# Patient Record
Sex: Male | Born: 1945 | Race: White | Hispanic: No | Marital: Married | Smoking: Former smoker
Health system: Southern US, Community
[De-identification: ages and names within clinical notes are randomized; demographics above are authoritative.]

## PROBLEM LIST (undated history)

## (undated) DIAGNOSIS — I10 Essential (primary) hypertension: Secondary | ICD-10-CM

## (undated) HISTORY — DX: Essential (primary) hypertension: I10

---

## 2013-03-27 HISTORY — PX: CATARACT EXTRACTION, BILATERAL: SHX1313

## 2013-10-02 LAB — LIPID PANEL
Cholesterol: 193 (ref 0–200)
HDL: 80 — AB (ref 35–70)
LDL Cholesterol: 102
Triglycerides: 56 (ref 40–160)

## 2013-10-02 LAB — CBC AND DIFFERENTIAL
HCT: 44 (ref 41–53)
Hemoglobin: 15.4 (ref 13.5–17.5)
Platelets: 276 (ref 150–399)
WBC: 8.3

## 2013-10-02 LAB — PSA: PSA: 4.28

## 2013-10-02 LAB — BASIC METABOLIC PANEL
BUN: 8 (ref 4–21)
CREATININE: 0.8 (ref 0.6–1.3)
GLUCOSE: 114
POTASSIUM: 4.8 (ref 3.4–5.3)
Sodium: 138 (ref 137–147)

## 2013-10-02 LAB — HEPATIC FUNCTION PANEL
ALT: 27 (ref 10–40)
AST: 30 (ref 14–40)
Alkaline Phosphatase: 80 (ref 25–125)
Bilirubin, Total: 1

## 2013-10-02 LAB — VITAMIN D 25 HYDROXY (VIT D DEFICIENCY, FRACTURES): VIT D 25 HYDROXY: 37

## 2013-10-02 LAB — TSH: TSH: 0.97 (ref 0.41–5.90)

## 2016-10-19 ENCOUNTER — Encounter: Payer: Self-pay | Admitting: Physician Assistant

## 2016-10-19 ENCOUNTER — Ambulatory Visit (INDEPENDENT_AMBULATORY_CARE_PROVIDER_SITE_OTHER): Payer: BLUE CROSS/BLUE SHIELD

## 2016-10-19 ENCOUNTER — Ambulatory Visit (INDEPENDENT_AMBULATORY_CARE_PROVIDER_SITE_OTHER): Payer: BLUE CROSS/BLUE SHIELD | Admitting: Physician Assistant

## 2016-10-19 VITALS — BP 133/86 | HR 75 | Temp 98.9°F | Resp 18 | Ht 69.0 in | Wt 227.0 lb

## 2016-10-19 DIAGNOSIS — K429 Umbilical hernia without obstruction or gangrene: Secondary | ICD-10-CM | POA: Diagnosis not present

## 2016-10-19 DIAGNOSIS — R0609 Other forms of dyspnea: Secondary | ICD-10-CM | POA: Diagnosis not present

## 2016-10-19 DIAGNOSIS — Z125 Encounter for screening for malignant neoplasm of prostate: Secondary | ICD-10-CM | POA: Diagnosis not present

## 2016-10-19 DIAGNOSIS — R059 Cough, unspecified: Secondary | ICD-10-CM

## 2016-10-19 DIAGNOSIS — G8929 Other chronic pain: Secondary | ICD-10-CM

## 2016-10-19 DIAGNOSIS — M545 Low back pain, unspecified: Secondary | ICD-10-CM

## 2016-10-19 DIAGNOSIS — Z9109 Other allergy status, other than to drugs and biological substances: Secondary | ICD-10-CM | POA: Diagnosis not present

## 2016-10-19 DIAGNOSIS — Z0000 Encounter for general adult medical examination without abnormal findings: Secondary | ICD-10-CM | POA: Diagnosis not present

## 2016-10-19 DIAGNOSIS — Z1211 Encounter for screening for malignant neoplasm of colon: Secondary | ICD-10-CM

## 2016-10-19 DIAGNOSIS — I10 Essential (primary) hypertension: Secondary | ICD-10-CM

## 2016-10-19 DIAGNOSIS — R05 Cough: Secondary | ICD-10-CM

## 2016-10-19 DIAGNOSIS — Z7689 Persons encountering health services in other specified circumstances: Secondary | ICD-10-CM | POA: Diagnosis not present

## 2016-10-19 LAB — POCT URINALYSIS DIP (MANUAL ENTRY)
Bilirubin, UA: NEGATIVE
Glucose, UA: NEGATIVE mg/dL
Ketones, POC UA: NEGATIVE mg/dL
Leukocytes, UA: NEGATIVE
NITRITE UA: NEGATIVE
PROTEIN UA: NEGATIVE mg/dL
RBC UA: NEGATIVE
SPEC GRAV UA: 1.015 (ref 1.010–1.025)
UROBILINOGEN UA: 1 U/dL
pH, UA: 8 (ref 5.0–8.0)

## 2016-10-19 MED ORDER — HYDROCHLOROTHIAZIDE 12.5 MG PO CAPS: 12.5000 mg | ORAL_CAPSULE | Freq: Every day | ORAL | 3 refills | Status: DC

## 2016-10-19 NOTE — Patient Instructions (Addendum)
   IF you received an x-ray today, you will receive an invoice from [facility]. Please contact [facility] at [phone] with questions or concerns regarding your invoice.   IF you received labwork today, you will receive an invoice from [facility]. Please contact [facility] at [phone] with questions or concerns regarding your invoice.   Our billing staff will not be able to assist you with questions regarding bills from these companies.  You will be contacted with the lab results as soon as they are available. The fastest way to get your results is to activate your My Chart account. Instructions are located on the last page of this paperwork. If you have not heard from us regarding the results in 2 weeks, please contact this office.     Keeping you healthy  Get these tests  Blood pressure- Have your blood pressure checked once a year by your healthcare provider.  Normal blood pressure is 120/80  Weight- Have your body mass index (BMI) calculated to screen for obesity.  BMI is a measure of body fat based on height and weight. You can also calculate your own BMI at [email or web address].[email or web address]/bmi/.  Cholesterol- Have your cholesterol checked every year.  Diabetes- Have your blood sugar checked regularly if you have high blood pressure, high cholesterol, have a family history of diabetes or if you are overweight.  Screening for Colon Cancer- Colonoscopy starting at age 50.  Screening may begin sooner depending on your family history and other health conditions. Follow up colonoscopy as directed by your Gastroenterologist.  Screening for Prostate Cancer- Both blood work (PSA) and a rectal exam help screen for Prostate Cancer.  Screening begins at age 40 with African-American men and at age 50 with Caucasian men.  Screening may begin sooner depending on your family history.  Take these medicines  Aspirin- One aspirin daily can help prevent Heart disease and Stroke.  Flu  shot- Every fall.  Tetanus- Every 10 years.  Zostavax- Once after the age of 60 to prevent Shingles.  Pneumonia shot- Once after the age of 65; if you are younger than 65, ask your healthcare provider if you need a Pneumonia shot.  Take these steps  Don't smoke- If you do smoke, talk to your doctor about quitting.  For tips on how to quit, go to www.smokefree.gov or call 1-800-QUIT-NOW.  Be physically active- Exercise 5 days a week for at least 30 minutes.  If you are not already physically active start slow and gradually work up to 30 minutes of moderate physical activity.  Examples of moderate activity include walking briskly, mowing the yard, dancing, swimming, bicycling, etc.  Eat a healthy diet- Eat a variety of healthy food such as fruits, vegetables, low fat milk, low fat cheese, yogurt, lean meant, poultry, fish, beans, tofu, etc. For more information go to www.thenutritionsource.org  Drink alcohol in moderation- Limit alcohol intake to less than two drinks a day. Never drink and drive.  Dentist- Brush and floss twice daily; visit your dentist twice a year.  Depression- Your emotional health is as important as your physical health. If you're feeling down, or losing interest in things you would normally enjoy please talk to your healthcare provider.  Eye exam- Visit your eye doctor every year.  Safe sex- If you may be exposed to a sexually transmitted infection, use a condom.  Seat belts- Seat belts can save your life; always wear one.  Smoke/Carbon Monoxide detectors- These detectors need to be installed on the appropriate   level of your home.  Replace batteries at least once a year.  Skin cancer- When out in the sun, cover up and use sunscreen 15 SPF or higher.  Violence- If anyone is threatening you, please tell your healthcare provider.  Living Will/ Health care power of attorney- Speak with your healthcare provider and family. 

## 2016-10-19 NOTE — Progress Notes (Signed)
Patient ID: Ernest Mckay, male    DOB: 02-20-1946, 71 y.o.   MRN: [id number]  PCP: Patient, No Pcp Per  Chief Complaint  Patient presents with  . Annual Exam  . Medication Refill    Hydrochlorothiazide    Subjective:   Presents for Annual Wellness Visit and to establish care.  Previously followed by Dr. Knox Royalty. We do not have previous records as yet.  Colorectal Cancer Screening: never Prostate Cancer Screening: unsure Bone Density Testing: never HIV Screening: never STI Screening: never, low risk Seasonal Influenza Vaccination: annually Td/Tdap Vaccination: unsure Pneumococcal Vaccination: unsure Zoster Vaccination: maybe last year Frequency of Eye evaluation: Annually with Dr. London Sheer   Patient Active Problem List   Diagnosis Date Noted  . Benign essential HTN 10/19/2016  . Environmental allergies 10/19/2016    Past Medical History:  Diagnosis Date  . Hypertension      Prior to Admission medications   Medication Sig Start Date End Date Taking? Authorizing Provider  hydrochlorothiazide (MICROZIDE) 12.5 MG capsule Take 12.5 mg by mouth daily. 07/13/16  Yes [provider]    No Known Allergies  Past Surgical History:  Procedure Laterality Date  . EYE SURGERY      History reviewed. No pertinent family history.  Social History   Social History  . Marital status: Married    Spouse name: N/A  . Number of children: N/A  . Years of education: N/A   Social History Main Topics  . Smoking status: Never Smoker  . Smokeless tobacco: Current User    Types: Snuff  . Alcohol use Yes     Comment: 6 drinks of beer   . Drug use: No  . Sexual activity: Yes   Other Topics Concern  . None   Social History Narrative  . None       Review of Systems  Constitutional: Negative.   HENT: Negative.   Eyes: Positive for discharge (LEFT only, clear) and itching (bilateral). Negative for photophobia, pain, redness and visual disturbance.    Respiratory: Positive for cough (x 1 year, non-productive, when he gets hot) and shortness of breath (with heavy exertion). Negative for apnea, choking, chest tightness, wheezing and stridor.   Cardiovascular: Negative.   Gastrointestinal: Negative.        Darker stools since starting vitamin B supplement; Increased bowel gas x 1 year, loud "growling" all day (embarrassing).  Endocrine: Negative.   Genitourinary: Positive for frequency (nocturia x 1, improved from 2-3x when stopped drinking liquids after 10 pm).  Musculoskeletal: Positive for back pain (does heavy lifting at work, improves with drinking beer; present x several years, previously saw a [facility]). Negative for arthralgias, gait problem, joint swelling, myalgias, neck pain and neck stiffness.  Skin: Negative.   Allergic/Immunologic: Negative.   Neurological: Negative.   Hematological: Negative.   Psychiatric/Behavioral: Negative.         Objective:  Physical Exam  Constitutional: He is oriented to person, place, and time. He appears well-developed and well-nourished. He is active and cooperative.  Non-toxic appearance. He does not have a sickly appearance. He does not appear ill. No distress.  BP 133/86 (BP Location: Right Arm, Patient Position: Sitting, Cuff Size: Large)   Pulse 75   Temp 98.9 F (37.2 C) (Oral)   Resp 18   Ht 5\' 9"  (1.753 m)   Wt 227 lb (103 kg)   SpO2 97%   BMI 33.52 kg/m    HENT:  Head: Normocephalic and atraumatic.  Right Ear: Hearing, tympanic membrane, external ear and ear canal normal.  Left Ear: Hearing, tympanic membrane, external ear and ear canal normal.  Nose: Nose normal.  Mouth/Throat: Uvula is midline, oropharynx is clear and moist and mucous membranes are normal. He does not have dentures. No oral lesions. No trismus in the jaw. Normal dentition. No dental abscesses, uvula swelling, lacerations or dental caries.  Eyes: Pupils are equal, round, and reactive to light.  Conjunctivae, EOM and lids are normal. Right eye exhibits no discharge. Left eye exhibits no discharge. No scleral icterus.  Fundoscopic exam:      The right eye shows no arteriolar narrowing, no AV nicking, no exudate, no hemorrhage and no papilledema.       The left eye shows no arteriolar narrowing, no AV nicking, no exudate, no hemorrhage and no papilledema.  Neck: Normal range of motion, full passive range of motion without pain and phonation normal. Neck supple. No spinous process tenderness and no muscular tenderness present. No neck rigidity. No tracheal deviation, no edema, no erythema and normal range of motion present. No thyromegaly present.  Cardiovascular: Normal rate, regular rhythm, S1 normal, S2 normal, normal heart sounds, intact distal pulses and normal pulses.  Exam reveals no gallop and no friction rub.   No murmur heard. Pulmonary/Chest: Effort normal and breath sounds normal. No respiratory distress. He has no wheezes. He has no rales.  Abdominal: Soft. Normal appearance and bowel sounds are normal. He exhibits no distension and no mass. There is no hepatosplenomegaly. There is no tenderness. There is no rebound and no guarding. A hernia is present. Hernia confirmed positive in the ventral area (umbilical, small, non-tender).  Musculoskeletal: Normal range of motion. He exhibits no edema or tenderness.       Cervical back: Normal. He exhibits normal range of motion, no tenderness, no bony tenderness, no swelling, no edema, no deformity, no laceration, no pain, no spasm and normal pulse.       Thoracic back: Normal.       Lumbar back: Normal.  Lymphadenopathy:       Head (right side): No submental, no submandibular, no tonsillar, no preauricular, no posterior auricular and no occipital adenopathy present.       Head (left side): No submental, no submandibular, no tonsillar, no preauricular, no posterior auricular and no occipital adenopathy present.    He has no cervical  adenopathy.       Right: No supraclavicular adenopathy present.       Left: No supraclavicular adenopathy present.  Neurological: He is alert and oriented to person, place, and time. He has normal strength and normal reflexes. He displays no tremor. No cranial nerve deficit. He exhibits normal muscle tone. Coordination and gait normal.  Skin: Skin is warm, dry and intact. Lesion (multiple nevi, seborrheic keratoses) noted. No abrasion, no ecchymosis, no laceration and no rash noted. He is not diaphoretic. No cyanosis or erythema. No pallor. Nails show no clubbing.  Nails are hyperpigmented, hypertrophic  Psychiatric: He has a normal mood and affect. His speech is normal and behavior is normal. Judgment and thought content normal. Cognition and memory are normal.    EKG reviewed with Dr. Katrinka BlazingSmith. NSR. No ischemia or LVH. No previous tracing for comparison.  Dg Chest 2 View  Result Date: 10/19/2016 CLINICAL DATA:  Cough for 1 year EXAM: CHEST  2 VIEW COMPARISON:  None. FINDINGS: No active infiltrate or effusion is seen. Mediastinal and hilar contours are unremarkable. The heart is  within normal limits in size. A moderate size hiatal hernia is present. There are degenerative changes throughout the thoracic spine. IMPRESSION: 1. No active cardiopulmonary disease. 2. Moderate size hiatal hernia. Electronically Signed   By: Dwyane DeePaul  Barry M.D.   On: 10/19/2016 09:59       Assessment & Plan:   Problem List Items Addressed This Visit    Benign essential HTN    Controlled. Continue HCTZ.      Relevant Medications   hydrochlorothiazide (MICROZIDE) 12.5 MG capsule   Other Relevant Orders   CBC with Differential/Platelet (Completed)   Comprehensive metabolic panel (Completed)   Lipid panel (Completed)   POCT urinalysis dipstick (Completed)   TSH (Completed)   Environmental allergies    Not bothersome enough to desire treatment.      Umbilical hernia without obstruction or gangrene     Asymptomatic. Will advise me if desires surgery referral or if develops pain.      Chronic low back pain without sciatica    Due to heavy work. No neurologic symptoms. Does not bother him enough to want additional evaluation or treatment at this time.       Other Visit Diagnoses    Annual physical exam    -  Primary   Age appropriate health guidance.   Encounter to establish care       Signed consent for release of information to obtain previous records.   Relevant Orders   Care order/instruction:   Dyspnea on exertion       Relevant Orders   DG Chest 2 View (Completed)   EKG 12-Lead (Completed)   Cough       Normal lung exam. Occurs when he gets hot. ? GERD? Allergy?   Relevant Orders   DG Chest 2 View (Completed)   Screening for colon cancer       Relevant Orders   Cologuard   Screening for prostate cancer       Relevant Orders   PSA (Completed)       Return in about 1 year (around 10/19/2017) for wellness exam.   Fernande Brashelle S. Killian Ress, PA-C [facility]

## 2016-10-20 LAB — COMPREHENSIVE METABOLIC PANEL
A/G RATIO: 2 (ref 1.2–2.2)
ALBUMIN: 4.3 g/dL (ref 3.5–4.8)
ALK PHOS: 81 IU/L (ref 39–117)
ALT: 31 IU/L (ref 0–44)
AST: 26 IU/L (ref 0–40)
BUN / CREAT RATIO: 7 — AB (ref 10–24)
BUN: 7 mg/dL — ABNORMAL LOW (ref 8–27)
Bilirubin Total: 0.9 mg/dL (ref 0.0–1.2)
CO2: 25 mmol/L (ref 20–29)
CREATININE: 0.94 mg/dL (ref 0.76–1.27)
Calcium: 9.2 mg/dL (ref 8.6–10.2)
Chloride: 102 mmol/L (ref 96–106)
GFR calc Af Amer: 95 mL/min/{1.73_m2} (ref >59)
GFR calc non Af Amer: 82 mL/min/{1.73_m2} (ref >59)
GLOBULIN, TOTAL: 2.1 g/dL (ref 1.5–4.5)
GLUCOSE: 101 mg/dL — AB (ref 65–99)
POTASSIUM: 4.6 mmol/L (ref 3.5–5.2)
SODIUM: 141 mmol/L (ref 134–144)
Total Protein: 6.4 g/dL (ref 6.0–8.5)

## 2016-10-20 LAB — LIPID PANEL
CHOL/HDL RATIO: 2.7 ratio (ref 0.0–5.0)
CHOLESTEROL TOTAL: 194 mg/dL (ref 100–199)
HDL: 71 mg/dL (ref >39)
LDL CALC: 108 mg/dL — AB (ref 0–99)
Triglycerides: 75 mg/dL (ref 0–149)
VLDL Cholesterol Cal: 15 mg/dL (ref 5–40)

## 2016-10-20 LAB — PSA: Prostate Specific Ag, Serum: 3.7 ng/mL (ref 0.0–4.0)

## 2016-10-20 LAB — CBC WITH DIFFERENTIAL/PLATELET
BASOS ABS: 0 10*3/uL (ref 0.0–0.2)
Basos: 1 %
EOS (ABSOLUTE): 0.2 10*3/uL (ref 0.0–0.4)
Eos: 3 %
HEMOGLOBIN: 14.8 g/dL (ref 13.0–17.7)
Hematocrit: 42.7 % (ref 37.5–51.0)
IMMATURE GRANS (ABS): 0 10*3/uL (ref 0.0–0.1)
Immature Granulocytes: 0 %
LYMPHS: 21 %
Lymphocytes Absolute: 1.3 10*3/uL (ref 0.7–3.1)
MCH: 33.6 pg — AB (ref 26.6–33.0)
MCHC: 34.7 g/dL (ref 31.5–35.7)
MCV: 97 fL (ref 79–97)
MONOCYTES: 10 %
Monocytes Absolute: 0.6 10*3/uL (ref 0.1–0.9)
NEUTROS ABS: 4 10*3/uL (ref 1.4–7.0)
Neutrophils: 65 %
Platelets: 320 10*3/uL (ref 150–379)
RBC: 4.41 x10E6/uL (ref 4.14–5.80)
RDW: 15.1 % (ref 12.3–15.4)
WBC: 6.1 10*3/uL (ref 3.4–10.8)

## 2016-10-20 LAB — TSH: TSH: 1.3 u[IU]/mL (ref 0.450–4.500)

## 2016-10-21 ENCOUNTER — Encounter: Payer: Self-pay | Admitting: Physician Assistant

## 2016-10-22 ENCOUNTER — Encounter: Payer: Self-pay | Admitting: Physician Assistant

## 2016-10-22 DIAGNOSIS — K429 Umbilical hernia without obstruction or gangrene: Secondary | ICD-10-CM | POA: Insufficient documentation

## 2016-10-22 DIAGNOSIS — G8929 Other chronic pain: Secondary | ICD-10-CM | POA: Insufficient documentation

## 2016-10-22 DIAGNOSIS — M545 Low back pain: Secondary | ICD-10-CM

## 2016-10-22 NOTE — Assessment & Plan Note (Signed)
Controlled.  Continue HCTZ. 

## 2016-10-22 NOTE — Assessment & Plan Note (Signed)
Not bothersome enough to desire treatment.

## 2016-10-22 NOTE — Assessment & Plan Note (Signed)
Due to heavy work. No neurologic symptoms. Does not bother him enough to want additional evaluation or treatment at this time.

## 2016-10-22 NOTE — Assessment & Plan Note (Signed)
Asymptomatic. Will advise me if desires surgery referral or if develops pain.

## 2016-11-01 ENCOUNTER — Telehealth: Payer: Self-pay | Admitting: Family Medicine

## 2016-11-01 NOTE — Telephone Encounter (Signed)
PT STATES THAT [facility] NEED US TO E-MAIL OR CALL THEM WITH A DX CODE FOR TEST

## 2016-11-02 NOTE — Telephone Encounter (Signed)
This information can be found in the record, most easily by clicking on the test in the labs tab. Diagnosis: Screening for colon cancer ICD-10 code: Z12.11

## 2016-11-02 NOTE — Telephone Encounter (Signed)
Please advise 

## 2016-11-05 LAB — COLOGUARD: Cologuard: NEGATIVE

## 2016-11-07 NOTE — Telephone Encounter (Signed)
Spoke with patient. Stool sample sent off with correct diagnosis code.

## 2016-11-20 ENCOUNTER — Encounter: Payer: Self-pay | Admitting: Physician Assistant

## 2016-11-20 ENCOUNTER — Telehealth: Payer: Self-pay | Admitting: Physician Assistant

## 2016-11-20 NOTE — Progress Notes (Signed)
Previous records reviewed from [facility]. Need to clarify with patient: 1. Smoking history (previous notes indicate every day smoker, our record states never smoker). 2. Zostavax (shingles vaccine) was ordered. Did he receive it?  Either way, he needs Shingrix vaccines (2 doses). 3. No documentation of hepatitis C screening, Pneumococcal vaccines nor tetanus vaccine.

## 2016-11-20 NOTE — Telephone Encounter (Signed)
Previous records reviewed from [facility]. Need to clarify with patient: 1. Smoking history (previous notes indicate every day smoker, our record states never smoker). What is correct? Please update record if needed. 2. Zostavax (shingles vaccine) was ordered. Did he receive it?  Either way, he needs Shingrix vaccines (2 doses). 3. No documentation of hepatitis C screening, Pneumococcal vaccines nor tetanus vaccine.  Please advise him that we will recommend those items at his next visit.  Also, the Cologuard was NEGATIVE. We will repeat it in 3 years.

## 2016-11-21 LAB — COLOGUARD: Cologuard: NEGATIVE

## 2016-11-21 NOTE — Telephone Encounter (Signed)
Smoking history changed to light smoker. Pt states he has an occasional cigar. Pt states he only remembers 1 Shingle shot. I advised him that vaccinations will be talked about next visit and gave him his Cologuard results.

## 2017-10-10 ENCOUNTER — Encounter: Payer: Self-pay | Admitting: Family Medicine

## 2017-10-10 ENCOUNTER — Ambulatory Visit (INDEPENDENT_AMBULATORY_CARE_PROVIDER_SITE_OTHER): Payer: BLUE CROSS/BLUE SHIELD | Admitting: Family Medicine

## 2017-10-10 ENCOUNTER — Other Ambulatory Visit: Payer: Self-pay

## 2017-10-10 VITALS — BP 130/70 | HR 94 | Temp 98.6°F | Resp 17 | Ht 68.11 in | Wt 216.4 lb

## 2017-10-10 DIAGNOSIS — R7301 Impaired fasting glucose: Secondary | ICD-10-CM | POA: Diagnosis not present

## 2017-10-10 DIAGNOSIS — Z131 Encounter for screening for diabetes mellitus: Secondary | ICD-10-CM

## 2017-10-10 DIAGNOSIS — Z0001 Encounter for general adult medical examination with abnormal findings: Secondary | ICD-10-CM

## 2017-10-10 DIAGNOSIS — R350 Frequency of micturition: Secondary | ICD-10-CM

## 2017-10-10 DIAGNOSIS — I10 Essential (primary) hypertension: Secondary | ICD-10-CM

## 2017-10-10 DIAGNOSIS — Z1159 Encounter for screening for other viral diseases: Secondary | ICD-10-CM | POA: Diagnosis not present

## 2017-10-10 DIAGNOSIS — Z0000 Encounter for general adult medical examination without abnormal findings: Secondary | ICD-10-CM

## 2017-10-10 NOTE — Progress Notes (Signed)
Chief Complaint  Patient presents with  . Annual Exam    cpe, former Ernest Mckay pt    Subjective:  Ernest Mckay is a 72 y.o. male here for a health maintenance visit.  Patient is established pt  Patient Active Problem List   Diagnosis Date Noted  . Umbilical hernia without obstruction or gangrene 10/22/2016  . Chronic low back pain without sciatica 10/22/2016  . Benign essential HTN 10/19/2016  . Environmental allergies 10/19/2016    Past Medical History:  Diagnosis Date  . Hypertension     Past Surgical History:  Procedure Laterality Date  . CATARACT EXTRACTION, BILATERAL Bilateral 2015     Outpatient Medications Prior to Visit  Medication Sig Dispense Refill  . hydrochlorothiazide (MICROZIDE) 12.5 MG capsule Take 1 capsule (12.5 mg total) by mouth daily. 90 capsule 3   No facility-administered medications prior to visit.     No Known Allergies   Family History  Problem Relation Age of Onset  . Heart attack Brother        "mild"  . Diabetes Maternal Grandmother      Health Habits: Dental Exam: up to date Eye Exam: up to date Exercise:  times/week on average Current exercise activities: walking/running Diet: balanced  Social History   Socioeconomic History  . Marital status: Married    Spouse name: Not on file  . Number of children: Not on file  . Years of education: 1 year of college  . Highest education level: Not on file  Occupational History  . 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 Financial resource strain: Not on file  . Food insecurity:    Worry: Not on file    Inability: Not on file  . Transportation needs:    Medical: Not on file    Non-medical: Not on file  Tobacco Use  . Smoking status: Former Smoker    Types: Cigars    Last attempt to quit: 10/11/2015    Years since quitting: 2.0  . Smokeless tobacco: Current User    Types: Snuff  . Tobacco comment: 50 years of snuff  use  Substance and Sexual Activity  . Alcohol use: Yes    Alcohol/week: 0.6 - 1.2 oz    Types: 1 - 2 Cans of beer per week    Comment: case/week (beer)  . Drug use: No  . Sexual activity: Yes  Lifestyle  . Physical activity:    Days per week: Not on file    Minutes per session: Not on file  . Stress: Not on file  Relationships  . Social connections:    Talks on phone: Not on file    Gets together: Not on file    Attends religious service: Not on file    Active member of club or organization: Not on file    Attends meetings of clubs or organizations: Not on file    Relationship status: Not on file  . Intimate partner violence:    Fear of current or ex partner: Not on file    Emotionally abused: Not on file    Physically abused: Not on file    Forced sexual activity: Not on file  Other Topics Concern  . Not on file  Social History Narrative   Lives with his wife.   Social History   Substance and Sexual Activity  Alcohol Use Yes  . Alcohol/week: 0.6 - 1.2 oz  .  Types: 1 - 2 Cans of beer per week   Comment: case/week (beer)   Social History   Tobacco Use  Smoking Status Former Smoker  . Types: Cigars  . Last attempt to quit: 10/11/2015  . Years since quitting: 2.0  Smokeless Tobacco Current User  . Types: Snuff  Tobacco Comment   50 years of snuff use   Social History   Substance and Sexual Activity  Drug Use No    Health Maintenance: See under health Maintenance activity for review of completion dates as well.  There is no immunization history on file for this patient.    Depression Screen-PHQ2/9 Depression screen [facility] 2/9 10/10/2017 10/19/2016  Decreased Interest 0 0  Down, Depressed, Hopeless 0 0  PHQ - 2 Score 0 0       Depression Severity and Treatment Recommendations:  0-4= None  5-9= Mild / Treatment: Support, educate to call if worse; return in one month  10-14= Moderate / Treatment: Support, watchful waiting; Antidepressant or Psycotherapy   15-19= Moderately severe / Treatment: Antidepressant OR Psychotherapy  >= 20 = Major depression, severe / Antidepressant AND Psychotherapy    Review of Systems   Review of Systems  Constitutional: Negative for chills and fever.  HENT: Negative for ear discharge, ear pain and tinnitus.   Eyes: Positive for blurred vision. Negative for double vision.       History of cataracts.   Respiratory: Negative for cough, shortness of breath and wheezing.   Cardiovascular: Negative for chest pain, palpitations and orthopnea.  Gastrointestinal: Negative for abdominal pain, diarrhea, nausea and vomiting.  Genitourinary: Negative for dysuria, frequency and urgency.  Skin: Negative for itching and rash.  Neurological: Positive for dizziness. Negative for tingling, tremors and headaches.       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 The patient is not nervous/anxious.     See HPI for ROS as well.    Objective:   Vitals:   10/10/17 1408  BP: 130/70  Pulse: 94  Resp: 17  Temp: 98.6 F (37 C)  TempSrc: Oral  SpO2: 99%  Weight: 216 lb 6.4 oz (98.2 kg)  Height: 5' 8.11" (1.73 m)    Wt Readings from Last 3 Encounters:  10/10/17 216 lb 6.4 oz (98.2 kg)  10/19/16 227 lb (103 kg)    Body mass index is 32.8 kg/m.  Physical Exam  BP 130/70 (BP Location: Right Arm, Patient Position: Sitting, Cuff Size: Normal)   Pulse 94   Temp 98.6 F (37 C) (Oral)   Resp 17   Ht 5' 8.11" (1.73 m)   Wt 216 lb 6.4 oz (98.2 kg)   SpO2 99%   BMI 32.80 kg/m   General Appearance:    Alert, cooperative, no distress, appears stated age  Head:    Normocephalic, without obvious abnormality, atraumatic  Eyes:    PERRL, conjunctiva/corneas clear, EOM's intact, fundi    benign, both eyes       Ears:    Normal TM's and external ear canals, both ears  Nose:   Nares normal, septum midline, mucosa normal, no drainage   or sinus tenderness    Throat:   Lips, mucosa, and tongue normal; teeth and gums normal  Neck:   Supple, symmetrical, trachea midline, no adenopathy;       thyroid:  No enlargement/tenderness/nodules; no carotid   bruit or JVD  Back:     Symmetric, no curvature, ROM normal,  no CVA tenderness  Lungs:     Clear to auscultation bilaterally, respirations unlabored  Chest wall:    No tenderness or deformity  Heart:    Regular rate and rhythm, S1 and S2 normal, no murmur, rub   or gallop  Abdomen:     Soft, non-tender, bowel sounds active all four quadrants,    Reducible umbilical hernia, no organomegaly  Rectal:    Normal tone, normal prostate, no masses or tenderness;   guaiac negative stool  Extremities:   Extremities normal, atraumatic, no cyanosis or edema  Pulses:   2+ and symmetric all extremities  Skin:   Skin color, texture, turgor normal, no rashes or lesions  Lymph nodes:   Cervical, supraclavicular, and axillary nodes normal  Neurologic:   CNII-XII intact. Normal strength, sensation and reflexes      throughout      Assessment/Plan:   Patient was seen for a health maintenance exam.  Counseled the patient on health maintenance issues. Reviewed her health mainteance schedule and ordered appropriate tests (see orders.) Counseled on regular exercise and weight management. Recommend regular eye exams and dental cleaning.   The following issues were addressed today for health maintenance:   Breandan was seen today for annual exam.  Diagnoses and all orders for this visit:  Annual physical exam -  Pt declined vaccinations Discussed cancer screenings Agreeable to prostate exam  Impaired fasting blood sugar Screening for diabetes mellitus  will check a1c -     Hemoglobin A1c  Essential hypertension- Patient's blood pressure is at goal of 139/89 or less. Condition is stable. Continue current medications and treatment plan. I recommend that you exercise for 30-45 minutes 5 days a week. I also  recommend a balanced diet with fruits and vegetables every day, lean meats, and little fried foods. The DASH diet (you can find this online) is a good example of this.  -     Lipid panel -     Comprehensive metabolic panel  Need for hepatitis C screening test -     HCV Ab w/Rflx to Verification      Return in about 1 year (around 10/11/2018) for physical.    Body mass index is 32.8 kg/m.:  Discussed the patient's BMI with patient. The BMI body mass index is 32.8 kg/m.     No future appointments.  Patient Instructions       IF you received an x-ray today, you will receive an invoice from [facility]. Please contact [facility] at [phone] with questions or concerns regarding your invoice.   IF you received labwork today, you will receive an invoice from [facility]. Please contact [facility] at [phone] with questions or concerns regarding your invoice.   Our billing staff will not be able to assist you with questions regarding bills from these companies.  You will be contacted with the lab results as soon as they are available. The fastest way to get your results is to activate your My Chart account. Instructions are located on the last page of this paperwork. If you have not heard from [place] regarding the results in 2 weeks, please contact this office.       Preventive Care 23 Years and Older, Male Preventive care refers to lifestyle choices and visits with your health care provider that can promote health and wellness. What does preventive care include?  A yearly physical exam. This is also called an annual well check.  Dental exams once or twice a year.  Routine eye exams. Ask your health care provider how often you should have your eyes checked.  Personal lifestyle choices, including: ? Daily care of your teeth and gums. ? Regular physical activity. ? Eating a healthy diet. ? Avoiding tobacco and drug use. ? Limiting alcohol  use. ? Practicing safe sex. ? Taking low doses of aspirin every day. ? Taking vitamin and mineral supplements as recommended by your health care provider. What happens during an annual well check? The services and screenings done by your health care provider during your annual well check will depend on your age, overall health, lifestyle risk factors, and family history of disease. Counseling Your health care provider may ask you questions about your:  Alcohol use.  Tobacco use.  Drug use.  Emotional well-being.  Home and relationship well-being.  Sexual activity.  Eating habits.  History of falls.  Memory and ability to understand (cognition).  Work and work Statistician.  Screening You may have the following tests or measurements:  Height, weight, and BMI.  Blood pressure.  Lipid and cholesterol levels. These may be checked every 5 years, or more frequently if you are over 52 years old.  Skin check.  Lung cancer screening. You may have this screening every year starting at age 34 if you have a 30-pack-year history of smoking and currently smoke or have quit within the past 15 years.  Fecal occult blood test (FOBT) of the stool. You may have this test every year starting at age 84.  Flexible sigmoidoscopy or colonoscopy. You may have a sigmoidoscopy every 5 years or a colonoscopy every 10 years starting at age 38.  Prostate cancer screening. Recommendations will vary depending on your family history and other risks.  Hepatitis C blood test.  Hepatitis B blood test.  Sexually transmitted disease (STD) testing.  Diabetes screening. This is done by checking your blood sugar (glucose) after you have not eaten for a while (fasting). You may have this done every 1-3 years.  Abdominal aortic aneurysm (AAA) screening. You may need this if you are a current or former smoker.  Osteoporosis. You may be screened starting at age 49 if you are at high risk.  Talk with  your health care provider about your test results, treatment options, and if necessary, the need for more tests. Vaccines Your health care provider may recommend certain vaccines, such as:  Influenza vaccine. This is recommended every year.  Tetanus, diphtheria, and acellular pertussis (Tdap, Td) vaccine. You may need a Td booster every 10 years.  Varicella vaccine. You may need this if you have not been vaccinated.  Zoster vaccine. You may need this after age 19.  Measles, mumps, and rubella (MMR) vaccine. You may need at least one dose of MMR if you were born in 1957 or later. You may also need a second dose.  Pneumococcal 13-valent conjugate (PCV13) vaccine. One dose is recommended after age 70.  Pneumococcal polysaccharide (PPSV23) vaccine. One dose is recommended after age 52.  Meningococcal vaccine. You may need this if you have certain conditions.  Hepatitis A vaccine. You may need this if you have certain conditions or if you travel or work in places where you may be exposed to hepatitis A.  Hepatitis B vaccine. You may need this if you have certain conditions or if you travel or work in places where you may be exposed to hepatitis B.  Haemophilus influenzae type b (Hib) vaccine. You may need this if you have certain risk factors.  Talk to your health care provider about which screenings and vaccines you need and how often you need them. This information is not intended to replace advice given to you by your health care provider. Make sure you discuss any questions you have with your health care provider. Document Released: 04/09/2015 Document Revised: 12/01/2015 Document Reviewed: 01/12/2015 Elsevier Interactive Patient Education  [employer].

## 2017-10-10 NOTE — Patient Instructions (Addendum)
   IF you received an x-ray today, you will receive an invoice from [facility]. Please contact [facility] at [phone] with questions or concerns regarding your invoice.   IF you received labwork today, you will receive an invoice from [facility]. Please contact [facility] at [phone] with questions or concerns regarding your invoice.   Our billing staff will not be able to assist you with questions regarding bills from these companies.  You will be contacted with the lab results as soon as they are available. The fastest way to get your results is to activate your My Chart account. Instructions are located on the last page of this paperwork. If you have not heard from us regarding the results in 2 weeks, please contact this office.      Preventive Care 71 Years and Older, Male Preventive care refers to lifestyle choices and visits with your health care provider that can promote health and wellness. What does preventive care include?  A yearly physical exam. This is also called an annual well check.  Dental exams once or twice a year.  Routine eye exams. Ask your health care provider how often you should have your eyes checked.  Personal lifestyle choices, including: ? Daily care of your teeth and gums. ? Regular physical activity. ? Eating a healthy diet. ? Avoiding tobacco and drug use. ? Limiting alcohol use. ? Practicing safe sex. ? Taking low doses of aspirin every day. ? Taking vitamin and mineral supplements as recommended by your health care provider. What happens during an annual well check? The services and screenings done by your health care provider during your annual well check will depend on your age, overall health, lifestyle risk factors, and family history of disease. Counseling Your health care provider may ask you questions about your:  Alcohol use.  Tobacco use.  Drug use.  Emotional well-being.  Home and relationship  well-being.  Sexual activity.  Eating habits.  History of falls.  Memory and ability to understand (cognition).  Work and work environment.  Screening You may have the following tests or measurements:  Height, weight, and BMI.  Blood pressure.  Lipid and cholesterol levels. These may be checked every 5 years, or more frequently if you are over 50 years old.  Skin check.  Lung cancer screening. You may have this screening every year starting at age 55 if you have a 30-pack-year history of smoking and currently smoke or have quit within the past 15 years.  Fecal occult blood test (FOBT) of the stool. You may have this test every year starting at age 50.  Flexible sigmoidoscopy or colonoscopy. You may have a sigmoidoscopy every 5 years or a colonoscopy every 10 years starting at age 50.  Prostate cancer screening. Recommendations will vary depending on your family history and other risks.  Hepatitis C blood test.  Hepatitis B blood test.  Sexually transmitted disease (STD) testing.  Diabetes screening. This is done by checking your blood sugar (glucose) after you have not eaten for a while (fasting). You may have this done every 1-3 years.  Abdominal aortic aneurysm (AAA) screening. You may need this if you are a current or former smoker.  Osteoporosis. You may be screened starting at age 70 if you are at high risk.  Talk with your health care provider about your test results, treatment options, and if necessary, the need for more tests. Vaccines Your health care provider may recommend certain vaccines, such as:  Influenza vaccine. This   is recommended every year.  Tetanus, diphtheria, and acellular pertussis (Tdap, Td) vaccine. You may need a Td booster every 10 years.  Varicella vaccine. You may need this if you have not been vaccinated.  Zoster vaccine. You may need this after age 60.  Measles, mumps, and rubella (MMR) vaccine. You may need at least one dose of  MMR if you were born in 1957 or later. You may also need a second dose.  Pneumococcal 13-valent conjugate (PCV13) vaccine. One dose is recommended after age 65.  Pneumococcal polysaccharide (PPSV23) vaccine. One dose is recommended after age 65.  Meningococcal vaccine. You may need this if you have certain conditions.  Hepatitis A vaccine. You may need this if you have certain conditions or if you travel or work in places where you may be exposed to hepatitis A.  Hepatitis B vaccine. You may need this if you have certain conditions or if you travel or work in places where you may be exposed to hepatitis B.  Haemophilus influenzae type b (Hib) vaccine. You may need this if you have certain risk factors.  Talk to your health care provider about which screenings and vaccines you need and how often you need them. This information is not intended to replace advice given to you by your health care provider. Make sure you discuss any questions you have with your health care provider. Document Released: 04/09/2015 Document Revised: 12/01/2015 Document Reviewed: 01/12/2015 Elsevier Interactive Patient Education  2018 Elsevier Inc.  

## 2017-10-11 LAB — COMPREHENSIVE METABOLIC PANEL
A/G RATIO: 2 (ref 1.2–2.2)
ALBUMIN: 4.6 g/dL (ref 3.5–4.8)
ALT: 62 IU/L — ABNORMAL HIGH (ref 0–44)
AST: 78 IU/L — ABNORMAL HIGH (ref 0–40)
Alkaline Phosphatase: 157 IU/L — ABNORMAL HIGH (ref 39–117)
BILIRUBIN TOTAL: 1.8 mg/dL — AB (ref 0.0–1.2)
BUN / CREAT RATIO: 9 — AB (ref 10–24)
BUN: 7 mg/dL — ABNORMAL LOW (ref 8–27)
CHLORIDE: 95 mmol/L — AB (ref 96–106)
CO2: 23 mmol/L (ref 20–29)
Calcium: 9 mg/dL (ref 8.6–10.2)
Creatinine, Ser: 0.81 mg/dL (ref 0.76–1.27)
GFR calc non Af Amer: 90 mL/min/{1.73_m2} (ref >59)
GFR, EST AFRICAN AMERICAN: 104 mL/min/{1.73_m2} (ref >59)
GLOBULIN, TOTAL: 2.3 g/dL (ref 1.5–4.5)
Glucose: 95 mg/dL (ref 65–99)
Potassium: 3.9 mmol/L (ref 3.5–5.2)
SODIUM: 136 mmol/L (ref 134–144)
TOTAL PROTEIN: 6.9 g/dL (ref 6.0–8.5)

## 2017-10-11 LAB — LIPID PANEL
CHOL/HDL RATIO: 2.1 ratio (ref 0.0–5.0)
Cholesterol, Total: 187 mg/dL (ref 100–199)
HDL: 88 mg/dL (ref >39)
LDL CALC: 89 mg/dL (ref 0–99)
TRIGLYCERIDES: 52 mg/dL (ref 0–149)
VLDL CHOLESTEROL CAL: 10 mg/dL (ref 5–40)

## 2017-10-11 LAB — HEMOGLOBIN A1C
Est. average glucose Bld gHb Est-mCnc: 108 mg/dL
Hgb A1c MFr Bld: 5.4 % (ref 4.8–5.6)

## 2017-10-11 LAB — HCV AB W/RFLX TO VERIFICATION

## 2017-10-11 LAB — HCV INTERPRETATION

## 2017-10-12 ENCOUNTER — Telehealth: Payer: Self-pay | Admitting: Family Medicine

## 2017-10-12 ENCOUNTER — Encounter: Payer: Self-pay | Admitting: Family Medicine

## 2017-10-12 DIAGNOSIS — R740 Nonspecific elevation of levels of transaminase and lactic acid dehydrogenase [LDH]: Principal | ICD-10-CM

## 2017-10-12 DIAGNOSIS — Z113 Encounter for screening for infections with a predominantly sexual mode of transmission: Secondary | ICD-10-CM

## 2017-10-12 DIAGNOSIS — R7401 Elevation of levels of liver transaminase levels: Secondary | ICD-10-CM

## 2017-10-12 NOTE — Telephone Encounter (Signed)
Left a detailed message that the patient is having elevation of the liver enzymes. Left message stating that if he has been using alcohol, tylenol or antibiotics it could increase his liver enzymes. His hep c was negative. Advise him to return to clinic in one month for lab testing. I would like to repeat the hepatic panel with a hepatitis panel and std testing.  Orders have been placed.

## 2017-10-15 ENCOUNTER — Other Ambulatory Visit: Payer: Self-pay | Admitting: *Deleted

## 2017-10-15 DIAGNOSIS — I10 Essential (primary) hypertension: Secondary | ICD-10-CM

## 2017-10-15 MED ORDER — HYDROCHLOROTHIAZIDE 12.5 MG PO CAPS: 12.5000 mg | ORAL_CAPSULE | Freq: Every day | ORAL | 3 refills | Status: DC

## 2017-10-15 NOTE — Telephone Encounter (Signed)
Patient called and states he would like to talk to Dr. Eldred MangesStalling about the message she left for him on last Friday. Please see below message CB# [phone]( this is his work number , When calling ask for the patient )

## 2017-10-15 NOTE — Telephone Encounter (Signed)
Patient was advised to refrain from alcohol and tylenol will come back in for his lab draw in 1 month. A refill of his blood pressure medication was sent in .Marland Kitchen..Marland Kitchen

## 2017-11-14 ENCOUNTER — Ambulatory Visit: Payer: BLUE CROSS/BLUE SHIELD

## 2017-11-14 DIAGNOSIS — R740 Nonspecific elevation of levels of transaminase and lactic acid dehydrogenase [LDH]: Secondary | ICD-10-CM

## 2017-11-14 DIAGNOSIS — Z113 Encounter for screening for infections with a predominantly sexual mode of transmission: Secondary | ICD-10-CM

## 2017-11-14 DIAGNOSIS — R7401 Elevation of levels of liver transaminase levels: Secondary | ICD-10-CM

## 2017-11-15 LAB — HEPATITIS PANEL, ACUTE
Hep A IgM: NEGATIVE
Hep B C IgM: NEGATIVE
Hep C Virus Ab: <0.1 s/co ratio (ref 0.0–0.9)
Hepatitis B Surface Ag: NEGATIVE

## 2017-11-15 LAB — HEPATIC FUNCTION PANEL
ALT: 35 IU/L (ref 0–44)
AST: 41 IU/L — AB (ref 0–40)
Albumin: 4.2 g/dL (ref 3.5–4.8)
Alkaline Phosphatase: 98 IU/L (ref 39–117)
BILIRUBIN TOTAL: 0.8 mg/dL (ref 0.0–1.2)
BILIRUBIN, DIRECT: 0.19 mg/dL (ref 0.00–0.40)
Total Protein: 6.4 g/dL (ref 6.0–8.5)

## 2017-11-15 LAB — HIV ANTIBODY (ROUTINE TESTING W REFLEX): HIV Screen 4th Generation wRfx: NONREACTIVE

## 2017-11-15 LAB — RPR: RPR Ser Ql: NONREACTIVE

## 2017-11-30 ENCOUNTER — Telehealth: Payer: Self-pay | Admitting: Family Medicine

## 2017-11-30 NOTE — Telephone Encounter (Signed)
Copied from CRM [id number]. Topic: Quick Communication - Lab Results >> Nov 30, 2017  2:53 PM Alexander Bergeron B wrote: Pt called to get labs sent in the mail

## 2017-12-01 NOTE — Telephone Encounter (Signed)
Message sent to  Lab to send out letter.

## 2017-12-10 NOTE — Telephone Encounter (Signed)
Pt states he has never received blood work results.  Pt would like for someone to call him at work with results.

## 2017-12-12 IMAGING — DX DG CHEST 2V
2 series · 2 of 2 positions shown · non-contrast
Comparison: None.

CLINICAL DATA: Cough for 1 year

EXAM:
CHEST  2 VIEW

[chest pa]
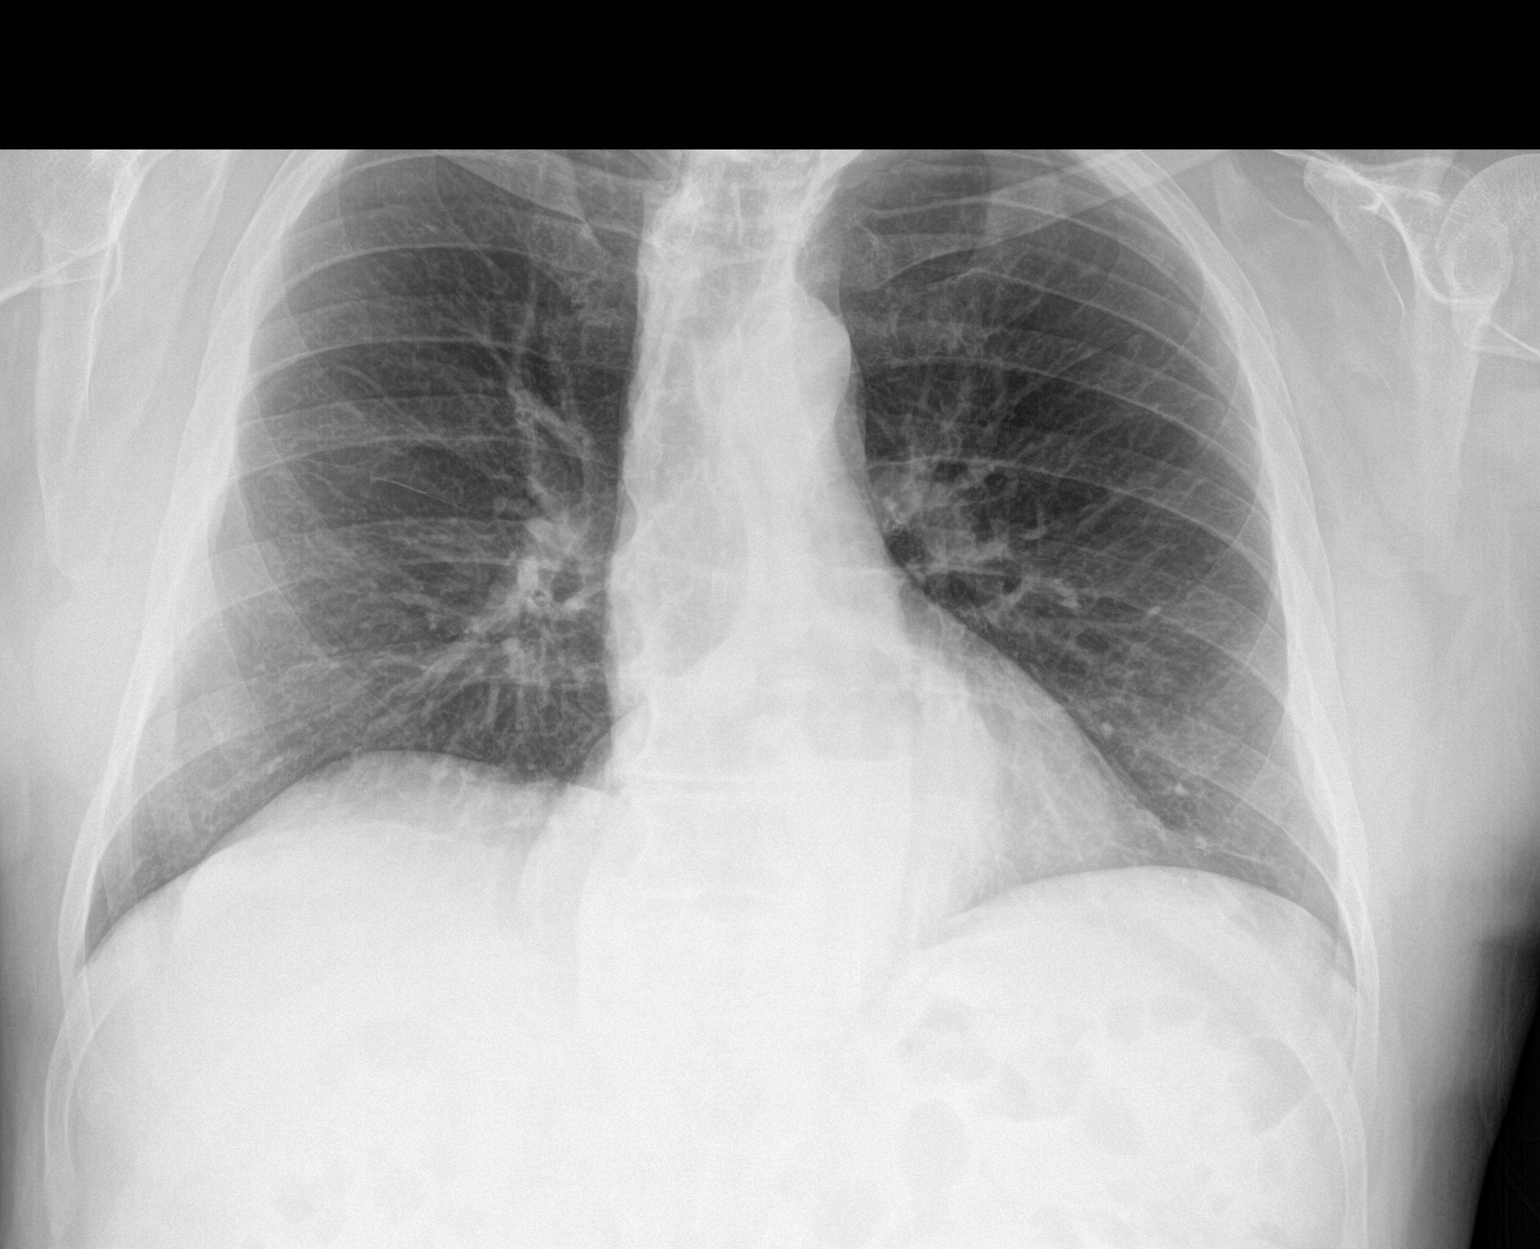

[chest lat]
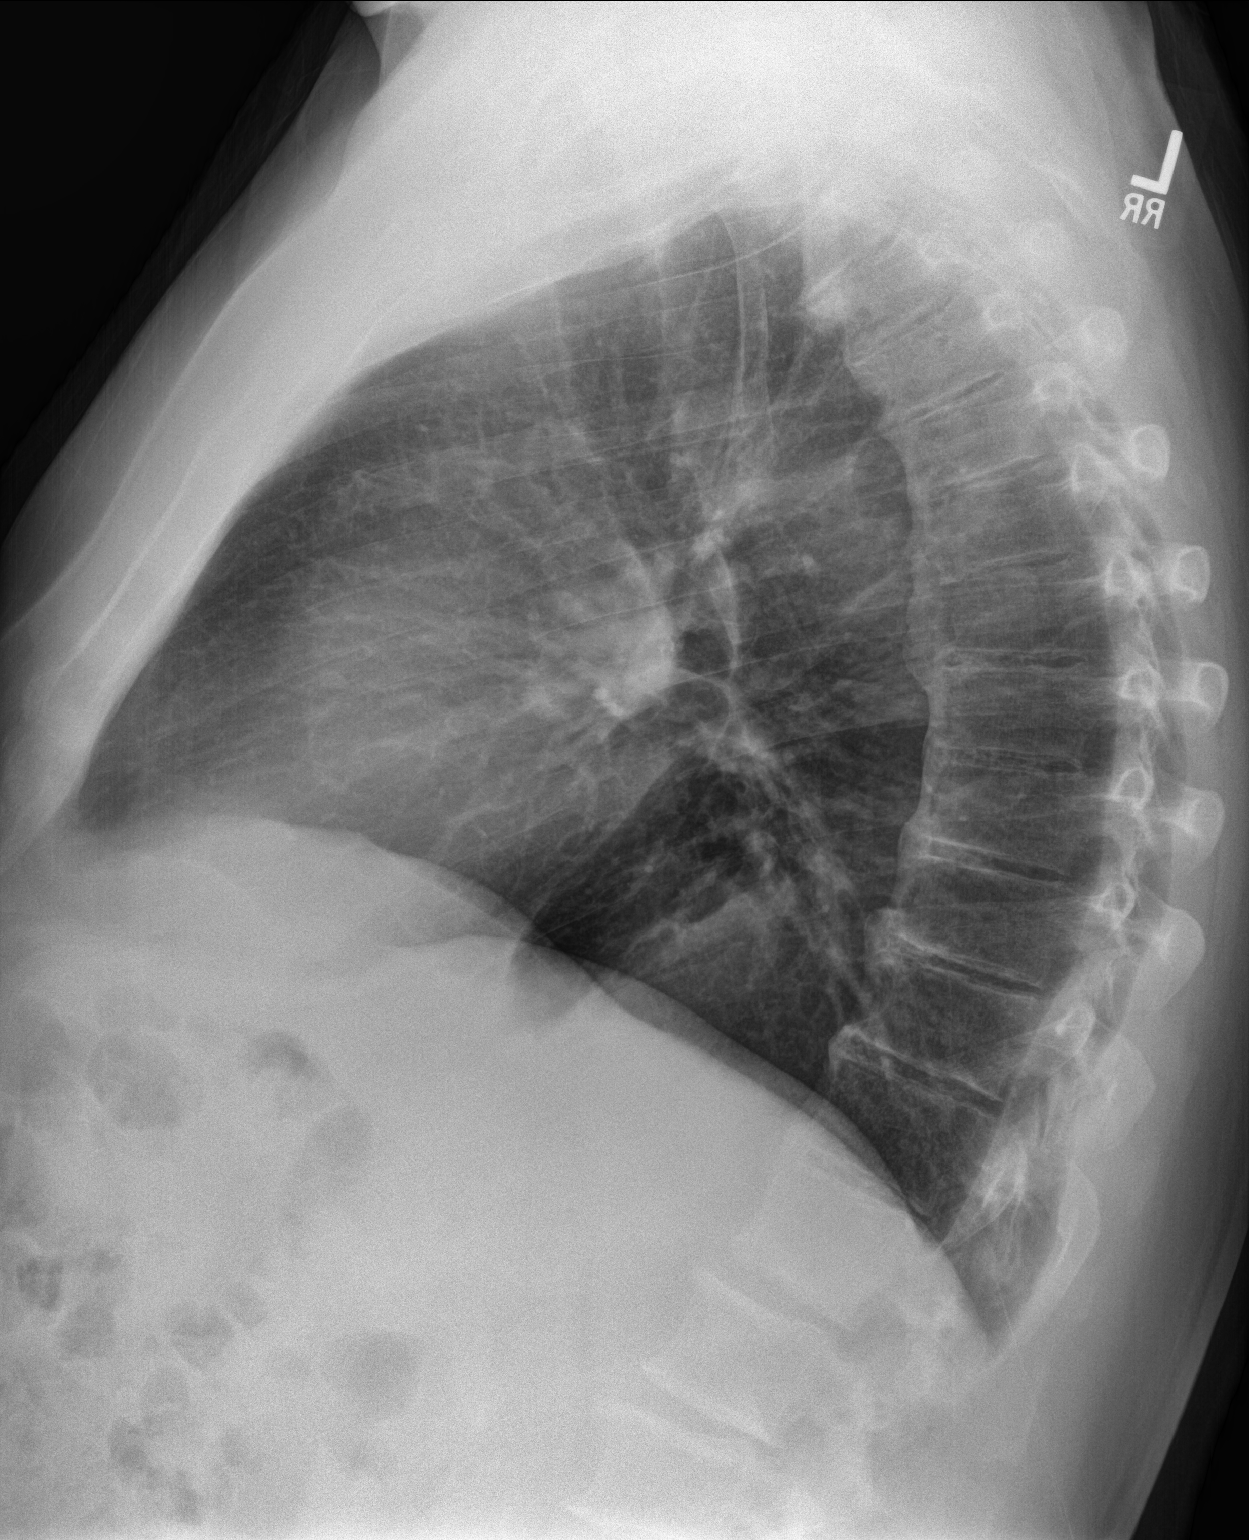

[2 of 2 positions shown; findings below may reference images not displayed]

FINDINGS: No active infiltrate or effusion is seen. Mediastinal and hilar
contours are unremarkable. The heart is within normal limits in
size. A moderate size hiatal hernia is present. There are
degenerative changes throughout the thoracic spine.
IMPRESSION: 1. No active cardiopulmonary disease.
2. Moderate size hiatal hernia.

## 2017-12-13 NOTE — Telephone Encounter (Signed)
Patient called to say that he has yet to receive lab results. He is asking for a call back today with those lab results labs was done on 11/14/17. Ph# [phone] will be at that number until 4.30pm

## 2017-12-14 NOTE — Telephone Encounter (Signed)
Please see the result note.  The patient result is normal. Std testing is negative for infection.

## 2017-12-17 NOTE — Telephone Encounter (Signed)
Patient was advised of results.  

## 2018-10-09 ENCOUNTER — Encounter: Payer: Self-pay | Admitting: Registered Nurse

## 2018-10-09 ENCOUNTER — Other Ambulatory Visit: Payer: Self-pay

## 2018-10-09 ENCOUNTER — Ambulatory Visit (INDEPENDENT_AMBULATORY_CARE_PROVIDER_SITE_OTHER): Payer: BLUE CROSS/BLUE SHIELD | Admitting: Registered Nurse

## 2018-10-09 VITALS — BP 122/82 | HR 89 | Temp 98.0°F | Resp 16 | Ht 69.09 in | Wt 210.0 lb

## 2018-10-09 DIAGNOSIS — R05 Cough: Secondary | ICD-10-CM

## 2018-10-09 DIAGNOSIS — Z13220 Encounter for screening for lipoid disorders: Secondary | ICD-10-CM | POA: Diagnosis not present

## 2018-10-09 DIAGNOSIS — R059 Cough, unspecified: Secondary | ICD-10-CM

## 2018-10-09 DIAGNOSIS — Z0001 Encounter for general adult medical examination with abnormal findings: Secondary | ICD-10-CM | POA: Diagnosis not present

## 2018-10-09 DIAGNOSIS — Z23 Encounter for immunization: Secondary | ICD-10-CM

## 2018-10-09 DIAGNOSIS — Z13228 Encounter for screening for other metabolic disorders: Secondary | ICD-10-CM

## 2018-10-09 DIAGNOSIS — Z130 Encounter for screening for diseases of the blood and blood-forming organs and certain disorders involving the immune mechanism: Secondary | ICD-10-CM

## 2018-10-09 DIAGNOSIS — Z125 Encounter for screening for malignant neoplasm of prostate: Secondary | ICD-10-CM

## 2018-10-09 DIAGNOSIS — I10 Essential (primary) hypertension: Secondary | ICD-10-CM

## 2018-10-09 DIAGNOSIS — L989 Disorder of the skin and subcutaneous tissue, unspecified: Secondary | ICD-10-CM

## 2018-10-09 DIAGNOSIS — Z1329 Encounter for screening for other suspected endocrine disorder: Secondary | ICD-10-CM | POA: Diagnosis not present

## 2018-10-09 MED ORDER — AZITHROMYCIN 250 MG PO TABS: ORAL_TABLET | ORAL | 0 refills | Status: DC

## 2018-10-09 MED ORDER — HYDROCHLOROTHIAZIDE 12.5 MG PO CAPS: 12.5000 mg | ORAL_CAPSULE | Freq: Every day | ORAL | 3 refills | Status: DC

## 2018-10-09 NOTE — Addendum Note (Signed)
Addended by: Maximiano Coss on: 10/09/2018 10:19 AM   Modules accepted: Orders

## 2018-10-09 NOTE — Patient Instructions (Addendum)
   If you have lab work done today you will be contacted with your lab results within the next 2 weeks.  If you have not heard from us then please contact us. The fastest way to get your results is to register for My Chart.   IF you received an x-ray today, you will receive an invoice from [facility]. Please contact [facility] at [phone] with questions or concerns regarding your invoice.   IF you received labwork today, you will receive an invoice from [facility]. Please contact [facility] at [phone] with questions or concerns regarding your invoice.   Our billing staff will not be able to assist you with questions regarding bills from these companies.  You will be contacted with the lab results as soon as they are available. The fastest way to get your results is to activate your My Chart account. Instructions are located on the last page of this paperwork. If you have not heard from us regarding the results in 2 weeks, please contact this office.       Health Maintenance, Male Adopting a healthy lifestyle and getting preventive care are important in promoting health and wellness. Ask your health care provider about:  The right schedule for you to have regular tests and exams.  Things you can do on your own to prevent diseases and keep yourself healthy. What should I know about diet, weight, and exercise? Eat a healthy diet   Eat a diet that includes plenty of vegetables, fruits, low-fat dairy products, and lean protein.  Do not eat a lot of foods that are high in solid fats, added sugars, or sodium. Maintain a healthy weight Body mass index (BMI) is a measurement that can be used to identify possible weight problems. It estimates body fat based on height and weight. Your health care provider can help determine your BMI and help you achieve or maintain a healthy weight. Get regular exercise Get regular exercise. This is one of the most important things  you can do for your health. Most adults should:  Exercise for at least 150 minutes each week. The exercise should increase your heart rate and make you sweat (moderate-intensity exercise).  Do strengthening exercises at least twice a week. This is in addition to the moderate-intensity exercise.  Spend less time sitting. Even light physical activity can be beneficial. Watch cholesterol and blood lipids Have your blood tested for lipids and cholesterol at 72 years of age, then have this test every 5 years. You may need to have your cholesterol levels checked more often if:  Your lipid or cholesterol levels are high.  You are older than 72 years of age.  You are at high risk for heart disease. What should I know about cancer screening? Many types of cancers can be detected early and may often be prevented. Depending on your health history and family history, you may need to have cancer screening at various ages. This may include screening for:  Colorectal cancer.  Prostate cancer.  Skin cancer.  Lung cancer. What should I know about heart disease, diabetes, and high blood pressure? Blood pressure and heart disease  High blood pressure causes heart disease and increases the risk of stroke. This is more likely to develop in people who have high blood pressure readings, are of African descent, or are overweight.  Talk with your health care provider about your target blood pressure readings.  Have your blood pressure checked: ? Every 3-5 years if you are   18-39 years of age. ? Every year if you are 40 years old or older.  If you are between the ages of 65 and 75 and are a current or former smoker, ask your health care provider if you should have a one-time screening for abdominal aortic aneurysm (AAA). Diabetes Have regular diabetes screenings. This checks your fasting blood sugar level. Have the screening done:  Once every three years after age 45 if you are at a normal weight and  have a low risk for diabetes.  More often and at a younger age if you are overweight or have a high risk for diabetes. What should I know about preventing infection? Hepatitis B If you have a higher risk for hepatitis B, you should be screened for this virus. Talk with your health care provider to find out if you are at risk for hepatitis B infection. Hepatitis C Blood testing is recommended for:  Everyone born from 1945 through 1965.  Anyone with known risk factors for hepatitis C. Sexually transmitted infections (STIs)  You should be screened each year for STIs, including gonorrhea and chlamydia, if: ? You are sexually active and are younger than 72 years of age. ? You are older than 72 years of age and your health care provider tells you that you are at risk for this type of infection. ? Your sexual activity has changed since you were last screened, and you are at increased risk for chlamydia or gonorrhea. Ask your health care provider if you are at risk.  Ask your health care provider about whether you are at high risk for HIV. Your health care provider may recommend a prescription medicine to help prevent HIV infection. If you choose to take medicine to prevent HIV, you should first get tested for HIV. You should then be tested every 3 months for as long as you are taking the medicine. Follow these instructions at home: Lifestyle  Do not use any products that contain nicotine or tobacco, such as cigarettes, e-cigarettes, and chewing tobacco. If you need help quitting, ask your health care provider.  Do not use street drugs.  Do not share needles.  Ask your health care provider for help if you need support or information about quitting drugs. Alcohol use  Do not drink alcohol if your health care provider tells you not to drink.  If you drink alcohol: ? Limit how much you have to 0-2 drinks a day. ? Be aware of how much alcohol is in your drink. In the [place] one drink equals  one 12 oz bottle of beer (355 mL), one 5 oz glass of wine (148 mL), or one 1 oz glass of hard liquor (44 mL). General instructions  Schedule regular health, dental, and eye exams.  Stay current with your vaccines.  Tell your health care provider if: ? You often feel depressed. ? You have ever been abused or do not feel safe at home. Summary  Adopting a healthy lifestyle and getting preventive care are important in promoting health and wellness.  Follow your health care provider's instructions about healthy diet, exercising, and getting tested or screened for diseases.  Follow your health care provider's instructions on monitoring your cholesterol and blood pressure. This information is not intended to replace advice given to you by your health care provider. Make sure you discuss any questions you have with your health care provider. Document Released: 09/09/2007 Document Revised: 03/06/2018 Document Reviewed: 03/06/2018 Elsevier Patient Education  2020 [employer]       Why follow it? Research shows. . Those who follow the Mediterranean diet have a reduced risk of heart disease  . The diet is associated with a reduced incidence of Parkinson's and Alzheimer's diseases . People following the diet may have longer life expectancies and lower rates of chronic diseases  . The Dietary Guidelines for Americans recommends the Mediterranean diet as an eating plan to promote health and prevent disease  What Is the Mediterranean Diet?  . Healthy eating plan based on typical foods and recipes of Mediterranean-style cooking . The diet is primarily a plant based diet; these foods should make up a majority of meals   Starches - Plant based foods should make up a majority of meals - They are an important sources of vitamins, minerals, energy, antioxidants, and fiber - Choose whole grains, foods high in fiber and minimally processed items  - Typical grain sources include wheat, oats, barley, corn,  brown rice, bulgar, farro, millet, polenta, couscous  - Various types of beans include chickpeas, lentils, fava beans, black beans, white beans   Fruits  Veggies - Large quantities of antioxidant rich fruits & veggies; 6 or more servings  - Vegetables can be eaten raw or lightly drizzled with oil and cooked  - Vegetables common to the traditional Mediterranean Diet include: artichokes, arugula, beets, broccoli, brussel sprouts, cabbage, carrots, celery, collard greens, cucumbers, eggplant, kale, leeks, lemons, lettuce, mushrooms, okra, onions, peas, peppers, potatoes, pumpkin, radishes, rutabaga, shallots, spinach, sweet potatoes, turnips, zucchini - Fruits common to the Mediterranean Diet include: apples, apricots, avocados, cherries, clementines, dates, figs, grapefruits, grapes, melons, nectarines, oranges, peaches, pears, pomegranates, strawberries, tangerines  Fats - Replace butter and margarine with healthy oils, such as olive oil, canola oil, and tahini  - Limit nuts to no more than a handful a day  - Nuts include walnuts, almonds, pecans, pistachios, pine nuts  - Limit or avoid candied, honey roasted or heavily salted nuts - Olives are central to the Mediterranean diet - can be eaten whole or used in a variety of dishes   Meats Protein - Limiting red meat: no more than a few times a month - When eating red meat: choose lean cuts and keep the portion to the size of deck of cards - Eggs: approx. 0 to 4 times a week  - Fish and lean poultry: at least 2 a week  - Healthy protein sources include, chicken, turkey, lean beef, lamb - Increase intake of seafood such as tuna, salmon, trout, mackerel, shrimp, scallops - Avoid or limit high fat processed meats such as sausage and bacon  Dairy - Include moderate amounts of low fat dairy products  - Focus on healthy dairy such as fat free yogurt, skim milk, low or reduced fat cheese - Limit dairy products higher in fat such as whole or 2% milk, cheese,  ice cream  Alcohol - Moderate amounts of red wine is ok  - No more than 5 oz daily for women (all ages) and men older than age 65  - No more than 10 oz of wine daily for men younger than 65  Other - Limit sweets and other desserts  - Use herbs and spices instead of salt to flavor foods  - Herbs and spices common to the traditional Mediterranean Diet include: basil, bay leaves, chives, cloves, cumin, fennel, garlic, lavender, marjoram, mint, oregano, parsley, pepper, rosemary, sage, savory, sumac, tarragon, thyme   It's not just a diet, it's a lifestyle:  . The Mediterranean diet   includes lifestyle factors typical of those in the region  . Foods, drinks and meals are best eaten with others and savored . Daily physical activity is important for overall good health . This could be strenuous exercise like running and aerobics . This could also be more leisurely activities such as walking, housework, yard-work, or taking the stairs . Moderation is the key; a balanced and healthy diet accommodates most foods and drinks . Consider portion sizes and frequency of consumption of certain foods   Meal Ideas & Options:  . Breakfast:  o Whole wheat toast or whole wheat English muffins with peanut butter & hard boiled egg o Steel cut oats topped with apples & cinnamon and skim milk  o Fresh fruit: banana, strawberries, melon, berries, peaches  o Smoothies: strawberries, bananas, greek yogurt, peanut butter o Low fat greek yogurt with blueberries and granola  o Egg white omelet with spinach and mushrooms o Breakfast couscous: whole wheat couscous, apricots, skim milk, cranberries  . Sandwiches:  o Hummus and grilled vegetables (peppers, zucchini, squash) on whole wheat bread   o Grilled chicken on whole wheat pita with lettuce, tomatoes, cucumbers or tzatziki  o Tuna salad on whole wheat bread: tuna salad made with greek yogurt, olives, red peppers, capers, green onions o Garlic rosemary lamb pita:  lamb sauted with garlic, rosemary, salt & pepper; add lettuce, cucumber, greek yogurt to pita - flavor with lemon juice and black pepper  . Seafood:  o Mediterranean grilled salmon, seasoned with garlic, basil, parsley, lemon juice and black pepper o Shrimp, lemon, and spinach whole-grain pasta salad made with low fat greek yogurt  o Seared scallops with lemon orzo  o Seared tuna steaks seasoned salt, pepper, coriander topped with tomato mixture of olives, tomatoes, olive oil, minced garlic, parsley, green onions and cappers  . Meats:  o Herbed greek chicken salad with kalamata olives, cucumber, feta  o Red bell peppers stuffed with spinach, bulgur, lean ground beef (or lentils) & topped with feta   o Kebabs: skewers of chicken, tomatoes, onions, zucchini, squash  o Turkey burgers: made with red onions, mint, dill, lemon juice, feta cheese topped with roasted red peppers . Vegetarian o Cucumber salad: cucumbers, artichoke hearts, celery, red onion, feta cheese, tossed in olive oil & lemon juice  o Hummus and whole grain pita points with a greek salad (lettuce, tomato, feta, olives, cucumbers, red onion) o Lentil soup with celery, carrots made with vegetable broth, garlic, salt and pepper  o Tabouli salad: parsley, bulgur, mint, scallions, cucumbers, tomato, radishes, lemon juice, olive oil, salt and pepper.       Fat and Cholesterol Restricted Eating Plan Eating a diet that limits fat and cholesterol may help lower your risk for heart disease and other conditions. Your body needs fat and cholesterol for basic functions, but eating too much of these things can be harmful to your health. Your health care provider may order lab tests to check your blood fat (lipid) and cholesterol levels. This helps your health care provider understand your risk for certain conditions and whether you need to make diet changes. Work with your health care provider or dietitian to make an eating plan that is right for  you. Your plan includes:  Limit your fat intake to ______% or less of your total calories a day.  Limit your saturated fat intake to ______% or less of your total calories a day.  Limit the amount of cholesterol in your diet to less   than _________mg a day.  Eat ___________ g of fiber a day. What are tips for following this plan? General guidelines   If you are overweight, work with your health care provider to lose weight safely. Losing just 5-10% of your body weight can improve your overall health and help prevent diseases such as diabetes and heart disease.  Avoid: ? Foods with added sugar. ? Fried foods. ? Foods that contain partially hydrogenated oils, including stick margarine, some tub margarines, cookies, crackers, and other baked goods.  Limit alcohol intake to no more than 1 drink a day for nonpregnant women and 2 drinks a day for men. One drink equals 12 oz of beer, 5 oz of wine, or 1 oz of hard liquor. Reading food labels  Check food labels for: ? Trans fats, partially hydrogenated oils, or high amounts of saturated fat. Avoid foods that contain saturated fat and trans fat. ? The amount of cholesterol in each serving. Try to eat no more than 200 mg of cholesterol each day. ? The amount of fiber in each serving. Try to eat at least 20-30 g of fiber each day.  Choose foods with healthy fats, such as: ? Monounsaturated and polyunsaturated fats. These include olive and canola oil, flaxseeds, walnuts, almonds, and seeds. ? Omega-3 fats. These are found in foods such as salmon, mackerel, sardines, tuna, flaxseed oil, and ground flaxseeds.  Choose grain products that have whole grains. Look for the word "whole" as the first word in the ingredient list. Cooking  Cook foods using methods other than frying. Baking, boiling, grilling, and broiling are some healthy options.  Eat more home-cooked food and less restaurant, buffet, and fast food.  Avoid cooking using saturated  fats. ? Animal sources of saturated fats include meats, butter, and cream. ? Plant sources of saturated fats include palm oil, palm kernel oil, and coconut oil. Meal planning   At meals, imagine dividing your plate into fourths: ? Fill one-half of your plate with vegetables and green salads. ? Fill one-fourth of your plate with whole grains. ? Fill one-fourth of your plate with lean protein foods.  Eat fish that is high in omega-3 fats at least two times a week.  Eat more foods that contain fiber, such as whole grains, beans, apples, broccoli, carrots, peas, and barley. These foods help promote healthy cholesterol levels in the blood. Recommended foods Grains  Whole grains, such as whole wheat or whole grain breads, crackers, cereals, and pasta. Unsweetened oatmeal, bulgur, barley, quinoa, or brown rice. Corn or whole wheat flour tortillas. Vegetables  Fresh or frozen vegetables (raw, steamed, roasted, or grilled). Green salads. Fruits  All fresh, canned (in natural juice), or frozen fruits. Meats and other protein foods  Ground beef (85% or leaner), grass-fed beef, or beef trimmed of fat. Skinless chicken or turkey. Ground chicken or turkey. Pork trimmed of fat. All fish and seafood. Egg whites. Dried beans, peas, or lentils. Unsalted nuts or seeds. Unsalted canned beans. Natural nut butters without added sugar and oil. Dairy  Low-fat or nonfat dairy products, such as skim or 1% milk, 2% or reduced-fat cheeses, low-fat and fat-free ricotta or cottage cheese, or plain low-fat and nonfat yogurt. Fats and oils  Tub margarine without trans fats. Light or reduced-fat mayonnaise and salad dressings. Avocado. Olive, canola, sesame, or safflower oils. The items listed above may not be a complete list of recommended foods or beverages. Contact your dietitian for more options. Foods to avoid Grains  White bread.   White pasta. White rice. Cornbread. Bagels, pastries, and croissants.  Crackers and snack foods that contain trans fat and hydrogenated oils. Vegetables  Vegetables cooked in cheese, cream, or butter sauce. Fried vegetables. Fruits  Canned fruit in heavy syrup. Fruit in cream or butter sauce. Fried fruit. Meats and other protein foods  Fatty cuts of meat. Ribs, chicken wings, bacon, sausage, bologna, salami, chitterlings, fatback, hot dogs, bratwurst, and packaged lunch meats. Liver and organ meats. Whole eggs and egg yolks. Chicken and turkey with skin. Fried meat. Dairy  Whole or 2% milk, cream, half-and-half, and cream cheese. Whole milk cheeses. Whole-fat or sweetened yogurt. Full-fat cheeses. Nondairy creamers and whipped toppings. Processed cheese, cheese spreads, and cheese curds. Beverages  Alcohol. Sugar-sweetened drinks such as sodas, lemonade, and fruit drinks. Fats and oils  Butter, stick margarine, lard, shortening, ghee, or bacon fat. Coconut, palm kernel, and palm oils. Sweets and desserts  Corn syrup, sugars, honey, and molasses. Candy. Jam and jelly. Syrup. Sweetened cereals. Cookies, pies, cakes, donuts, muffins, and ice cream. The items listed above may not be a complete list of foods and beverages to avoid. Contact your dietitian for more information. Summary  Your body needs fat and cholesterol for basic functions. However, eating too much of these things can be harmful to your health.  Work with your health care provider and dietitian to follow a diet low in fat and cholesterol. Doing this may help lower your risk for heart disease and other conditions.  Choose healthy fats, such as monounsaturated and polyunsaturated fats, and foods high in omega-3 fatty acids.  Eat fiber-rich foods, such as whole grains, beans, peas, fruits, and vegetables.  Limit or avoid alcohol, fried foods, and foods high in saturated fats, partially hydrogenated oils, and sugar. This information is not intended to replace advice given to you by your health  care provider. Make sure you discuss any questions you have with your health care provider. Document Released: 03/13/2005 Document Revised: 02/23/2017 Document Reviewed: 11/28/2016 Elsevier Patient Education  2020 [employer] Inc.  American Heart Association (AHA) Exercise Recommendation  Being physically active is important to prevent heart disease and stroke, the nation's No. 1and No. 5killers. To improve overall cardiovascular health, we suggest at least 150 minutes per week of moderate exercise or 75 minutes per week of vigorous exercise (or a combination of moderate and vigorous activity). Thirty minutes a day, five times a week is an easy goal to remember. You will also experience benefits even if you divide your time into two or three segments of 10 to 15 minutes per day.  For people who would benefit from lowering their blood pressure or cholesterol, we recommend 40 minutes of aerobic exercise of moderate to vigorous intensity three to four times a week to lower the risk for heart attack and stroke.  Physical activity is anything that makes you move your body and burn calories.  This includes things like climbing stairs or playing sports. Aerobic exercises benefit your heart, and include walking, jogging, swimming or biking. Strength and stretching exercises are best for overall stamina and flexibility.  The simplest, positive change you can make to effectively improve your heart health is to start walking. It's enjoyable, free, easy, social and great exercise. A walking program is flexible and boasts high success rates because people can stick with it. It's easy for walking to become a regular and satisfying part of life.   For Overall Cardiovascular Health:  At least 30 minutes of moderate-intensity aerobic activity   at least 5 days per week for a total of 150  OR   At least 25 minutes of vigorous aerobic activity at least 3 days per week for a total of 75 minutes; or a combination of  moderate- and vigorous-intensity aerobic activity  AND   Moderate- to high-intensity muscle-strengthening activity at least 2 days per week for additional health benefits.  For Lowering Blood Pressure and Cholesterol  An average 40 minutes of moderate- to vigorous-intensity aerobic activity 3 or 4 times per week  What if I can't make it to the time goal? Something is always better than nothing! And everyone has to start somewhere. Even if you've been sedentary for years, today is the day you can begin to make healthy changes in your life. If you don't think you'll make it for 30 or 40 minutes, set a reachable goal for today. You can work up toward your overall goal by increasing your time as you get stronger. Don't let all-or-nothing thinking rob you of doing what you can every day.  Source:http://www.heart.org    

## 2018-10-09 NOTE — Progress Notes (Signed)
Established Patient Office Visit  Subjective:  Patient ID: Ernest Mckay, male    DOB: [birth year]  Age: 73 y.o. MRN: [id number]  CC:  Chief Complaint  Patient presents with  . Annual Exam    HPI Ernest Mckay presents for CPE and med refills  HTN: Taking HCTZ 12.5mg  PO qd with good effect. Plan to continue. No red flag symptoms.  Other history includes umbilical hernia, which has not changed and does not present problems for him.  Complains today of SOB and cough x 6 months. 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 He has used snuff for 50 years. He smokes occasionally - mostly cigars when drinking 2-3 times each year.  He states it is a somewhat productive cough, with some discolored sputum. Denies hemoptysis, LOC, chest pain, headaches, GI symptoms, body aches, known COVID exposure, change in taste or smell.  Past Medical History:  Diagnosis Date  . Hypertension     Past Surgical History:  Procedure Laterality Date  . CATARACT EXTRACTION, BILATERAL Bilateral 2015    Family History  Problem Relation Age of Onset  . Heart attack Brother        "mild"  . Diabetes Maternal Grandmother     Social History   Socioeconomic History  . Marital status: Married    Spouse name: Not on file  . Number of children: 1  . Years of education: 1 year of college  . Highest education level: Not on file  Occupational History  . Occupation: Air cabin crew    Comment: [employer] (Nurse, adult) x 25 years  Social Needs  . Financial resource strain: Not hard at all  . Food insecurity    Worry: Never true    Inability: Never true  . Transportation needs    Medical: No    Non-medical: No  Tobacco Use  . Smoking status: Former Smoker    Types: Cigars    Quit date: 10/11/2015    Years since quitting: 2.9  . Smokeless tobacco: Current User    Types: Snuff  . Tobacco comment: 50 years of snuff use  Substance and Sexual Activity  .  Alcohol use: Not Currently    Alcohol/week: 1.0 - 2.0 standard drinks    Types: 1 - 2 Cans of beer per week    Comment: quit in March 2020  . Drug use: No  . Sexual activity: Yes  Lifestyle  . Physical activity    Days per week: 3 days    Minutes per session: 30 min  . Stress: Not at all  Relationships  . Social Herbalist on phone: Three times a week    Gets together: Twice a week    Attends religious service: Patient refused    Active member of club or organization: Patient refused    Attends meetings of clubs or organizations: Patient refused    Relationship status: Married  . Intimate partner violence    Fear of current or ex partner: No    Emotionally abused: No    Physically abused: No    Forced sexual activity: No  Other Topics Concern  . Not on file  Social History Narrative   Lives with his wife.    Outpatient Medications Prior to Visit  Medication Sig Dispense Refill  . hydrochlorothiazide (MICROZIDE) 12.5 MG capsule Take 1 capsule (12.5 mg total) by mouth daily. 90 capsule 3   No facility-administered medications  prior to visit.     No Known Allergies  ROS Review of Systems  Constitutional: Negative.   HENT: Negative.  Negative for congestion, sinus pain and sore throat.   Eyes: Negative.   Respiratory: Positive for cough, shortness of breath and wheezing. Negative for chest tightness.   Cardiovascular: Negative.  Negative for chest pain, palpitations and leg swelling.  Gastrointestinal: Negative.  Negative for blood in stool, constipation, diarrhea, nausea and vomiting.  Endocrine: Negative.   Genitourinary: Negative.   Musculoskeletal: Negative.   Skin: Negative.   Allergic/Immunologic: Negative.   Neurological: Negative.  Negative for headaches.  Hematological: Negative.   Psychiatric/Behavioral: Negative.   All other systems reviewed and are negative.     Objective:    Physical Exam  Constitutional: He appears well-developed and  well-nourished. No distress.  HENT:  Head: Normocephalic and atraumatic.  Right Ear: External ear normal.  Left Ear: External ear normal.  Nose: Nose normal.  Mouth/Throat: Oropharynx is clear and moist. No oropharyngeal exudate.  Eyes: Pupils are equal, round, and reactive to light. Conjunctivae and EOM are normal. Right eye exhibits no discharge. Left eye exhibits no discharge. No scleral icterus.  Neck: Normal range of motion. Neck supple. No tracheal deviation present. No thyromegaly present.  Cardiovascular: Normal rate, regular rhythm, normal heart sounds and intact distal pulses. Exam reveals no gallop and no friction rub.  No murmur heard. Pulmonary/Chest: Effort normal. No tachypnea and no bradypnea. No respiratory distress. He has wheezes in the right middle field, the right lower field, the left middle field and the left lower field. He has no rales. He exhibits no tenderness.  Abdominal: Soft. Bowel sounds are normal. He exhibits no distension and no mass. There is no abdominal tenderness. There is no rebound and no guarding.  Musculoskeletal: Normal range of motion.        General: No tenderness, deformity or edema.  Lymphadenopathy:    He has no cervical adenopathy.  Neurological: He is alert. No cranial nerve deficit.  Skin: Skin is warm and dry. No rash noted. He is not diaphoretic. No erythema. No pallor.  Psychiatric: He has a normal mood and affect. His behavior is normal. Judgment and thought content normal.  Nursing note and vitals reviewed.   BP (!) 142/86   Pulse 89   Temp 98 F (36.7 C) (Oral)   Resp 16   Ht 5' 9.09" (1.755 m)   Wt 210 lb (95.3 kg)   SpO2 97%   BMI 30.93 kg/m  Wt Readings from Last 3 Encounters:  10/09/18 210 lb (95.3 kg)  10/10/17 216 lb 6.4 oz (98.2 kg)  10/19/16 227 lb (103 kg)     There are no preventive care reminders to display for this patient.  There are no preventive care reminders to display for this patient.  Lab Results   Component Value Date   TSH 1.300 10/19/2016   Lab Results  Component Value Date   WBC 6.1 10/19/2016   HGB 14.8 10/19/2016   HCT 42.7 10/19/2016   MCV 97 10/19/2016   PLT 320 10/19/2016   Lab Results  Component Value Date   NA 136 10/10/2017   K 3.9 10/10/2017   CO2 23 10/10/2017   GLUCOSE 95 10/10/2017   BUN 7 (L) 10/10/2017   CREATININE 0.81 10/10/2017   BILITOT 0.8 11/14/2017   ALKPHOS 98 11/14/2017   AST 41 (H) 11/14/2017   ALT 35 11/14/2017   PROT 6.4 11/14/2017   ALBUMIN 4.2  11/14/2017   CALCIUM 9.0 10/10/2017   Lab Results  Component Value Date   CHOL 187 10/10/2017   Lab Results  Component Value Date   HDL 88 10/10/2017   Lab Results  Component Value Date   LDLCALC 89 10/10/2017   Lab Results  Component Value Date   TRIG 52 10/10/2017   Lab Results  Component Value Date   CHOLHDL 2.1 10/10/2017   Lab Results  Component Value Date   HGBA1C 5.4 10/10/2017      Assessment & Plan:   Problem List Items Addressed This Visit    None    Visit Diagnoses    Screening for endocrine, metabolic and immunity disorder    -  Primary   Relevant Orders   CBC with Differential/Platelet   Comprehensive metabolic panel   Hemoglobin A1c   TSH   Prostate cancer screening       Relevant Orders   PSA   Lipid screening       Relevant Orders   Lipid panel   Need for diphtheria-tetanus-pertussis (Tdap) vaccine       Relevant Orders   Tdap vaccine greater than or equal to 7yo IM   Need for prophylactic vaccination against Streptococcus pneumoniae (pneumococcus)       Relevant Orders   Pneumococcal conjugate vaccine 13-valent IM   Encounter for general adult medical examination with abnormal findings          No orders of the defined types were placed in this encounter.   Follow-up: Return in 1 year (on 10/09/2019).   PLAN:  Bilateral wheezing in RMF, RLF, LMF, LLF on expiration. Pt with no major smoking history warranting imaging, however, with known  exposure to Agent Orange, we will refer to Pulmonology for assessment. COPD vs Asthma. Will give him albuterol inhaler today, reviewed its use, reviewed Red flag symptoms and reasons to be concerned. In addition, to treat as COPD exacerbation, will give Azithromycin 500mg  PO day one followed by 250mg  PO days two through five.  Some lesions on back suspicious for malignancy: abnormal borders, wide diameter, elevated. Actinic Keratosis vs Basal cell vs Squamous vs Melanoma, pt should be assessed by Dermatology.  Otherwise normal findings on exam  Labs drawn, will follow as warranted.  Patient encouraged to call clinic with any questions, comments, or concerns.  Janeece Ageeichard Mercer Stallworth, NP

## 2018-10-10 LAB — TSH: TSH: 1.11 u[IU]/mL (ref 0.450–4.500)

## 2018-10-10 LAB — COMPREHENSIVE METABOLIC PANEL
ALT: 27 IU/L (ref 0–44)
AST: 22 IU/L (ref 0–40)
Albumin/Globulin Ratio: 2 (ref 1.2–2.2)
Albumin: 4.7 g/dL (ref 3.7–4.7)
Alkaline Phosphatase: 104 IU/L (ref 39–117)
BUN/Creatinine Ratio: 12 (ref 10–24)
BUN: 9 mg/dL (ref 8–27)
Bilirubin Total: 0.8 mg/dL (ref 0.0–1.2)
CO2: 21 mmol/L (ref 20–29)
Calcium: 9.5 mg/dL (ref 8.6–10.2)
Chloride: 100 mmol/L (ref 96–106)
Creatinine, Ser: 0.77 mg/dL (ref 0.76–1.27)
GFR calc Af Amer: 106 mL/min/{1.73_m2} (ref >59)
GFR calc non Af Amer: 91 mL/min/{1.73_m2} (ref >59)
Globulin, Total: 2.4 g/dL (ref 1.5–4.5)
Glucose: 105 mg/dL — ABNORMAL HIGH (ref 65–99)
Potassium: 4 mmol/L (ref 3.5–5.2)
Sodium: 139 mmol/L (ref 134–144)
Total Protein: 7.1 g/dL (ref 6.0–8.5)

## 2018-10-10 LAB — CBC WITH DIFFERENTIAL/PLATELET
Basophils Absolute: 0.1 10*3/uL (ref 0.0–0.2)
Basos: 2 %
EOS (ABSOLUTE): 0.6 10*3/uL — ABNORMAL HIGH (ref 0.0–0.4)
Eos: 9 %
Hematocrit: 45.2 % (ref 37.5–51.0)
Hemoglobin: 15.8 g/dL (ref 13.0–17.7)
Immature Grans (Abs): 0 10*3/uL (ref 0.0–0.1)
Immature Granulocytes: 0 %
Lymphocytes Absolute: 1.6 10*3/uL (ref 0.7–3.1)
Lymphs: 23 %
MCH: 32.5 pg (ref 26.6–33.0)
MCHC: 35 g/dL (ref 31.5–35.7)
MCV: 93 fL (ref 79–97)
Monocytes Absolute: 0.7 10*3/uL (ref 0.1–0.9)
Monocytes: 10 %
Neutrophils Absolute: 4 10*3/uL (ref 1.4–7.0)
Neutrophils: 56 %
Platelets: 338 10*3/uL (ref 150–450)
RBC: 4.86 x10E6/uL (ref 4.14–5.80)
RDW: 13.1 % (ref 11.6–15.4)
WBC: 7.1 10*3/uL (ref 3.4–10.8)

## 2018-10-10 LAB — HEMOGLOBIN A1C
Est. average glucose Bld gHb Est-mCnc: 117 mg/dL
Hgb A1c MFr Bld: 5.7 % — ABNORMAL HIGH (ref 4.8–5.6)

## 2018-10-10 LAB — LIPID PANEL
Chol/HDL Ratio: 3.6 ratio (ref 0.0–5.0)
Cholesterol, Total: 223 mg/dL — ABNORMAL HIGH (ref 100–199)
HDL: 62 mg/dL (ref >39)
LDL Calculated: 148 mg/dL — ABNORMAL HIGH (ref 0–99)
Triglycerides: 67 mg/dL (ref 0–149)
VLDL Cholesterol Cal: 13 mg/dL (ref 5–40)

## 2018-10-10 LAB — PSA: Prostate Specific Ag, Serum: 3.8 ng/mL (ref 0.0–4.0)

## 2018-10-11 ENCOUNTER — Other Ambulatory Visit: Payer: Self-pay | Admitting: Registered Nurse

## 2018-10-11 DIAGNOSIS — E785 Hyperlipidemia, unspecified: Secondary | ICD-10-CM

## 2018-10-11 MED ORDER — PRAVASTATIN SODIUM 20 MG PO TABS: 20.0000 mg | ORAL_TABLET | Freq: Every day | ORAL | 3 refills | Status: DC

## 2018-10-11 NOTE — Progress Notes (Signed)
Called pt to review labs. He will start on pravastatin 20mg  PO qd  Kathrin Ruddy NP

## 2018-10-11 NOTE — Progress Notes (Signed)
Pt shows elevated lipids on labs Will start Pravastatin 20mg  PO qd   Kathrin Ruddy NP

## 2019-02-05 ENCOUNTER — Other Ambulatory Visit: Payer: Self-pay

## 2019-02-05 DIAGNOSIS — L989 Disorder of the skin and subcutaneous tissue, unspecified: Secondary | ICD-10-CM

## 2019-10-08 ENCOUNTER — Encounter: Payer: Self-pay | Admitting: Registered Nurse

## 2019-10-08 ENCOUNTER — Ambulatory Visit (INDEPENDENT_AMBULATORY_CARE_PROVIDER_SITE_OTHER): Payer: BLUE CROSS/BLUE SHIELD | Admitting: Registered Nurse

## 2019-10-08 ENCOUNTER — Other Ambulatory Visit: Payer: Self-pay

## 2019-10-08 VITALS — BP 137/80 | HR 74 | Temp 97.8°F | Resp 18 | Ht 70.0 in | Wt 209.2 lb

## 2019-10-08 DIAGNOSIS — Z0000 Encounter for general adult medical examination without abnormal findings: Secondary | ICD-10-CM

## 2019-10-08 DIAGNOSIS — Z125 Encounter for screening for malignant neoplasm of prostate: Secondary | ICD-10-CM | POA: Diagnosis not present

## 2019-10-08 DIAGNOSIS — Z130 Encounter for screening for diseases of the blood and blood-forming organs and certain disorders involving the immune mechanism: Secondary | ICD-10-CM

## 2019-10-08 DIAGNOSIS — I10 Essential (primary) hypertension: Secondary | ICD-10-CM

## 2019-10-08 DIAGNOSIS — Z0001 Encounter for general adult medical examination with abnormal findings: Secondary | ICD-10-CM | POA: Diagnosis not present

## 2019-10-08 DIAGNOSIS — Z1329 Encounter for screening for other suspected endocrine disorder: Secondary | ICD-10-CM | POA: Diagnosis not present

## 2019-10-08 DIAGNOSIS — Z13228 Encounter for screening for other metabolic disorders: Secondary | ICD-10-CM | POA: Diagnosis not present

## 2019-10-08 DIAGNOSIS — R05 Cough: Secondary | ICD-10-CM | POA: Diagnosis not present

## 2019-10-08 DIAGNOSIS — Z13220 Encounter for screening for lipoid disorders: Secondary | ICD-10-CM

## 2019-10-08 DIAGNOSIS — R059 Cough, unspecified: Secondary | ICD-10-CM

## 2019-10-08 LAB — POCT GLYCOSYLATED HEMOGLOBIN (HGB A1C): Hemoglobin A1C: 5.3 % (ref 4.0–5.6)

## 2019-10-08 MED ORDER — HYDROCHLOROTHIAZIDE 12.5 MG PO CAPS: 12.5000 mg | ORAL_CAPSULE | Freq: Every day | ORAL | 3 refills | Status: AC

## 2019-10-08 MED ORDER — MONTELUKAST SODIUM 10 MG PO TABS: 10.0000 mg | ORAL_TABLET | Freq: Every day | ORAL | 3 refills | Status: AC

## 2019-10-08 NOTE — Patient Instructions (Signed)
° ° ° °  If you have lab work done today you will be contacted with your lab results within the next 2 weeks.  If you have not heard from us then please contact us. The fastest way to get your results is to register for My Chart. ° ° °IF you received an x-ray today, you will receive an invoice from [facility]. Please contact [facility] at [phone] with questions or concerns regarding your invoice.  ° °IF you received labwork today, you will receive an invoice from [facility]. Please contact [facility] at [phone] with questions or concerns regarding your invoice.  ° °Our billing staff will not be able to assist you with questions regarding bills from these companies. ° °You will be contacted with the lab results as soon as they are available. The fastest way to get your results is to activate your My Chart account. Instructions are located on the last page of this paperwork. If you have not heard from us regarding the results in 2 weeks, please contact this office. °  ° ° ° °

## 2019-10-08 NOTE — Progress Notes (Signed)
Established Patient Office Visit  Subjective:  Patient ID: Ernest Mckay Stollings, male    DOB: 1945-12-01  Age: 74 y.o. MRN: [id number]  CC:  Chief Complaint  Patient presents with  . Annual Exam    Patient states he is here for his annual exam and also would like to receive a medication for congestion for a few months been taking a inhaler to help with congestion,    HPI Ernest Mckay Sennett presents for CPE and labs  Notes that he does have some cough. Worse at night. Relieved during the day while moving around and working. Does get worse with time outside and yard work. Cough is not productive. Sometimes wheezy. Does not smoke but does dip. No chest pain, shob, doe, dependent edema, visual changes, claudication.   Otherwise feeling well. Still working, but considering retirement options, particularly regarding insurance - plans [facility] with [facility]  Needs refill on HCTZ 12.5mg  PO qd. Tolerating this well  Past Medical History:  Diagnosis Date  . Hypertension     Past Surgical History:  Procedure Laterality Date  . CATARACT EXTRACTION, BILATERAL Bilateral 2015    Family History  Problem Relation Age of Onset  . Heart attack Brother        "mild"  . Diabetes Maternal Grandmother     Social History   Socioeconomic History  . Marital status: Married    Spouse name: Not on file  . Number of children: 1  . Years of education: 1 year of college  . Highest education level: Not on file  Occupational History  . Occupation: Data processing managerBranch Manager    Comment: [employer]ensed [employer]) x 25 years  Tobacco Use  . Smoking status: Former Smoker    Types: Cigars    Quit date: 10/11/2015    Years since quitting: 3.9  . Smokeless tobacco: Current User    Types: Snuff  . Tobacco comment: 50 years of snuff use  Vaping Use  . Vaping Use: Never used  Substance and Sexual Activity  . Alcohol use: Not Currently    Alcohol/week: 1.0 - 2.0 standard drink    Types: 1 - 2 Cans of beer  per week    Comment: quit in March 2020  . Drug use: No  . Sexual activity: Yes  Other Topics Concern  . Not on file  Social History Narrative   Lives with his wife.   Social Determinants of Health   Financial Resource Strain: Low Risk   . Difficulty of Paying Living Expenses: Not hard at all  Food Insecurity: No Food Insecurity  . Worried About Programme researcher, broadcasting/film/videounning Out of Food in the Last Year: Never true  . Ran Out of Food in the Last Year: Never true  Transportation Needs: No Transportation Needs  . Lack of Transportation (Medical): No  . Lack of Transportation (Non-Medical): No  Physical Activity: Insufficiently Active  . Days of Exercise per Week: 3 days  . Minutes of Exercise per Session: 30 min  Stress: No Stress Concern Present  . Feeling of Stress : Not at all  Social Connections: Unknown  . Frequency of Communication with Friends and Family: Three times a week  . Frequency of Social Gatherings with Friends and Family: Twice a week  . Attends Religious Services: Patient refused  . Active Member of Clubs or Organizations: Patient refused  . Attends BankerClub or Organization Meetings: Patient refused  . Marital Status: Married  Catering managerntimate Partner Violence: Not At Risk  .  Fear of Current or Ex-Partner: No  . Emotionally Abused: No  . Physically Abused: No  . Sexually Abused: No    Outpatient Medications Prior to Visit  Medication Sig Dispense Refill  . hydrochlorothiazide (MICROZIDE) 12.5 MG capsule Take 1 capsule (12.5 mg total) by mouth daily. 90 capsule 3  . azithromycin (ZITHROMAX) 250 MG tablet Take 2 tablets on first day, then take 1 tablet per day. (Patient not taking: Reported on 10/08/2019) 6 tablet 0  . pravastatin (PRAVACHOL) 20 MG tablet Take 1 tablet (20 mg total) by mouth daily. (Patient not taking: Reported on 10/08/2019) 90 tablet 3   No facility-administered medications prior to visit.    No Known Allergies  ROS Review of Systems  Constitutional: Negative.   HENT:  Negative.   Eyes: Negative.   Respiratory: Positive for cough (intermittent) and wheezing (mild). Negative for apnea, choking, chest tightness, shortness of breath and stridor.   Cardiovascular: Negative.   Gastrointestinal: Negative.   Endocrine: Negative.   Genitourinary: Negative.   Musculoskeletal: Negative.   Skin: Negative.   Allergic/Immunologic: Negative.   Neurological: Negative.   Hematological: Negative.   Psychiatric/Behavioral: Negative.   All other systems reviewed and are negative.     Objective:    Physical Exam Vitals and nursing note reviewed.  Constitutional:      General: He is not in acute distress.    Appearance: Normal appearance. He is normal weight. He is not ill-appearing, toxic-appearing or diaphoretic.  HENT:     Head: Normocephalic and atraumatic.     Right Ear: Tympanic membrane, ear canal and external ear normal. There is no impacted cerumen.     Left Ear: Tympanic membrane, ear canal and external ear normal. There is no impacted cerumen.     Nose: Nose normal. No congestion or rhinorrhea.     Mouth/Throat:     Mouth: Mucous membranes are moist.     Pharynx: Oropharynx is clear. No oropharyngeal exudate or posterior oropharyngeal erythema.  Eyes:     General: No scleral icterus.       Right eye: No discharge.        Left eye: No discharge.     Extraocular Movements: Extraocular movements intact.     Conjunctiva/sclera: Conjunctivae normal.     Pupils: Pupils are equal, round, and reactive to light.  Cardiovascular:     Rate and Rhythm: Normal rate and regular rhythm.     Pulses: Normal pulses.     Heart sounds: Normal heart sounds. No murmur heard.  No friction rub. No gallop.   Pulmonary:     Effort: Pulmonary effort is normal. No respiratory distress.     Breath sounds: No stridor. Wheezing (basalar, mild) present. No rhonchi or rales.  Chest:     Chest wall: No tenderness.  Abdominal:     General: Abdomen is flat. Bowel sounds are  normal. There is no distension.     Palpations: Abdomen is soft. There is no mass.     Tenderness: There is no abdominal tenderness. There is no right CVA tenderness, left CVA tenderness, guarding or rebound.     Hernia: No hernia is present.  Musculoskeletal:        General: No swelling, tenderness, deformity or signs of injury. Normal range of motion.     Right lower leg: No edema.     Left lower leg: No edema.  Skin:    General: Skin is warm and dry.     Capillary Refill:  Capillary refill takes less than 2 seconds.     Coloration: Skin is not jaundiced or pale.     Findings: No bruising, erythema, lesion or rash.  Neurological:     General: No focal deficit present.     Mental Status: He is alert and oriented to person, place, and time. Mental status is at baseline.     Cranial Nerves: No cranial nerve deficit.     Sensory: No sensory deficit.     Motor: No weakness.     Coordination: Coordination abnormal (mild resting tremor. Pt states is intermittent. Does not affect fine motor skills or balance.).     Gait: Gait normal.     Deep Tendon Reflexes: Reflexes normal.  Psychiatric:        Mood and Affect: Mood normal.        Behavior: Behavior normal.        Thought Content: Thought content normal.        Judgment: Judgment normal.     BP 137/80   Pulse 74   Temp 97.8 F (36.6 C) (Temporal)   Resp 18   Ht 5\' 10"  (1.778 m)   Wt 209 lb 3.2 oz (94.9 kg)   SpO2 96%   BMI 30.02 kg/m  Wt Readings from Last 3 Encounters:  10/08/19 209 lb 3.2 oz (94.9 kg)  10/09/18 210 lb (95.3 kg)  10/10/17 216 lb 6.4 oz (98.2 kg)     There are no preventive care reminders to display for this patient.  There are no preventive care reminders to display for this patient.  Lab Results  Component Value Date   TSH 1.110 10/09/2018   Lab Results  Component Value Date   WBC 7.1 10/09/2018   HGB 15.8 10/09/2018   HCT 45.2 10/09/2018   MCV 93 10/09/2018   PLT 338 10/09/2018   Lab  Results  Component Value Date   NA 139 10/09/2018   K 4.0 10/09/2018   CO2 21 10/09/2018   GLUCOSE 105 (H) 10/09/2018   BUN 9 10/09/2018   CREATININE 0.77 10/09/2018   BILITOT 0.8 10/09/2018   ALKPHOS 104 10/09/2018   AST 22 10/09/2018   ALT 27 10/09/2018   PROT 7.1 10/09/2018   ALBUMIN 4.7 10/09/2018   CALCIUM 9.5 10/09/2018   Lab Results  Component Value Date   CHOL 223 (H) 10/09/2018   Lab Results  Component Value Date   HDL 62 10/09/2018   Lab Results  Component Value Date   LDLCALC 148 (H) 10/09/2018   Lab Results  Component Value Date   TRIG 67 10/09/2018   Lab Results  Component Value Date   CHOLHDL 3.6 10/09/2018   Lab Results  Component Value Date   HGBA1C 5.7 (H) 10/09/2018      Assessment & Plan:   Problem List Items Addressed This Visit      Cardiovascular and Mediastinum   Benign essential HTN   Relevant Medications   hydrochlorothiazide (MICROZIDE) 12.5 MG capsule   Other Relevant Orders   Comprehensive metabolic panel   CBC With Differential   Lipid panel   TSH   POCT glycosylated hemoglobin (Hb A1C)   PSA    Other Visit Diagnoses    Screening PSA (prostate specific antigen)    -  Primary   Relevant Orders   PSA   Screening for endocrine, metabolic and immunity disorder       Relevant Orders   Comprehensive metabolic panel   CBC With Differential  TSH   POCT glycosylated hemoglobin (Hb A1C)   Lipid screening       Relevant Orders   Lipid panel   Cough       Relevant Medications   montelukast (SINGULAIR) 10 MG tablet      Meds ordered this encounter  Medications  . montelukast (SINGULAIR) 10 MG tablet    Sig: Take 1 tablet (10 mg total) by mouth at bedtime.    Dispense:  30 tablet    Refill:  3    Order Specific Question:   Supervising Provider    Answer:   Neva Seat, JEFFREY R [[id number]]  . hydrochlorothiazide (MICROZIDE) 12.5 MG capsule    Sig: Take 1 capsule (12.5 mg total) by mouth daily.    Dispense:  90 capsule     Refill:  3    Order Specific Question:   Supervising Provider    Answer:   Neva Seat, JEFFREY R [[id number]]    Follow-up: No follow-ups on file.   PLAN  Cough seems to be inflammatory rather than infectious. Favor allergic bronchitis. Will give montelukast and monitor effect. If no relief will consider referral to pulmonary.  Refill hctz 12.5mg  PO qd for one year  Labs collected, will follow up as warranted  Suggest exploring medicare and VA options for coverage when retirement comes  Patient encouraged to call clinic with any questions, comments, or concerns.  Janeece Agee, NP

## 2019-10-09 ENCOUNTER — Encounter: Payer: Self-pay | Admitting: Registered Nurse

## 2019-10-09 LAB — COMPREHENSIVE METABOLIC PANEL
ALT: 26 IU/L (ref 0–44)
AST: 29 IU/L (ref 0–40)
Albumin/Globulin Ratio: 2.4 — ABNORMAL HIGH (ref 1.2–2.2)
Albumin: 4.5 g/dL (ref 3.7–4.7)
Alkaline Phosphatase: 128 IU/L — ABNORMAL HIGH (ref 48–121)
BUN/Creatinine Ratio: 9 — ABNORMAL LOW (ref 10–24)
BUN: 7 mg/dL — ABNORMAL LOW (ref 8–27)
Bilirubin Total: 3.1 mg/dL — ABNORMAL HIGH (ref 0.0–1.2)
CO2: 19 mmol/L — ABNORMAL LOW (ref 20–29)
Calcium: 8.7 mg/dL (ref 8.6–10.2)
Chloride: 99 mmol/L (ref 96–106)
Creatinine, Ser: 0.77 mg/dL (ref 0.76–1.27)
GFR calc Af Amer: 105 mL/min/{1.73_m2} (ref >59)
GFR calc non Af Amer: 91 mL/min/{1.73_m2} (ref >59)
Globulin, Total: 1.9 g/dL (ref 1.5–4.5)
Glucose: 107 mg/dL — ABNORMAL HIGH (ref 65–99)
Potassium: 4.3 mmol/L (ref 3.5–5.2)
Sodium: 135 mmol/L (ref 134–144)
Total Protein: 6.4 g/dL (ref 6.0–8.5)

## 2019-10-09 LAB — PSA: Prostate Specific Ag, Serum: 4.5 ng/mL — ABNORMAL HIGH (ref 0.0–4.0)

## 2019-10-09 LAB — CBC WITH DIFFERENTIAL
Basophils Absolute: 0.1 10*3/uL (ref 0.0–0.2)
Basos: 1 %
EOS (ABSOLUTE): 0.6 10*3/uL — ABNORMAL HIGH (ref 0.0–0.4)
Eos: 8 %
Hematocrit: 44.9 % (ref 37.5–51.0)
Hemoglobin: 16.3 g/dL (ref 13.0–17.7)
Immature Grans (Abs): 0 10*3/uL (ref 0.0–0.1)
Immature Granulocytes: 0 %
Lymphocytes Absolute: 1.1 10*3/uL (ref 0.7–3.1)
Lymphs: 16 %
MCH: 34.5 pg — ABNORMAL HIGH (ref 26.6–33.0)
MCHC: 36.3 g/dL — ABNORMAL HIGH (ref 31.5–35.7)
MCV: 95 fL (ref 79–97)
Monocytes Absolute: 0.8 10*3/uL (ref 0.1–0.9)
Monocytes: 12 %
Neutrophils Absolute: 4.4 10*3/uL (ref 1.4–7.0)
Neutrophils: 63 %
RBC: 4.73 x10E6/uL (ref 4.14–5.80)
RDW: 14.4 % (ref 11.6–15.4)
WBC: 7 10*3/uL (ref 3.4–10.8)

## 2019-10-09 LAB — TSH: TSH: 1.16 u[IU]/mL (ref 0.450–4.500)

## 2019-10-09 LAB — LIPID PANEL
Chol/HDL Ratio: 1.6 ratio (ref 0.0–5.0)
Cholesterol, Total: 177 mg/dL (ref 100–199)
HDL: 114 mg/dL (ref >39)
LDL Chol Calc (NIH): 52 mg/dL (ref 0–99)
Triglycerides: 53 mg/dL (ref 0–149)
VLDL Cholesterol Cal: 11 mg/dL (ref 5–40)

## 2023-06-11 ENCOUNTER — Encounter (HOSPITAL_COMMUNITY): Payer: Self-pay

## 2023-06-11 NOTE — Progress Notes (Signed)
 Called patient to see if Ernest Mckay was interested in participating in the Pulmonary Rehab Program. Patient will come in for orientation on 06/18/23 and will attend the 8:15 exercise class.  Pensions consultant.

## 2023-06-11 NOTE — Progress Notes (Signed)
 Received referral from Dr. Charline Bills for this pt to participate in Pulmonary Rehab with the diagnosis of COPD. Clinical review of pt follow up appt on 05/31/23 Pulmonary office note. Pt appropriate for scheduling for Pulmonary rehab. Will forward to support staff for scheduling and verification of insurance eligibility/benefits with pt consent.   [facility]

## 2023-06-15 ENCOUNTER — Other Ambulatory Visit (HOSPITAL_COMMUNITY): Payer: Self-pay

## 2023-06-15 ENCOUNTER — Encounter (HOSPITAL_COMMUNITY): Payer: Self-pay

## 2023-06-15 DIAGNOSIS — K449 Diaphragmatic hernia without obstruction or gangrene: Secondary | ICD-10-CM | POA: Insufficient documentation

## 2023-06-15 DIAGNOSIS — J449 Chronic obstructive pulmonary disease, unspecified: Secondary | ICD-10-CM | POA: Insufficient documentation

## 2023-06-15 DIAGNOSIS — E785 Hyperlipidemia, unspecified: Secondary | ICD-10-CM | POA: Insufficient documentation

## 2023-06-15 DIAGNOSIS — K760 Fatty (change of) liver, not elsewhere classified: Secondary | ICD-10-CM | POA: Insufficient documentation

## 2023-06-15 DIAGNOSIS — R918 Other nonspecific abnormal finding of lung field: Secondary | ICD-10-CM | POA: Insufficient documentation

## 2023-06-15 DIAGNOSIS — Z7729 Contact with and (suspected ) exposure to other hazardous substances: Secondary | ICD-10-CM | POA: Insufficient documentation

## 2023-06-18 ENCOUNTER — Encounter (HOSPITAL_COMMUNITY): Payer: Self-pay

## 2023-06-18 ENCOUNTER — Encounter (HOSPITAL_COMMUNITY)
Admission: RE | Admit: 2023-06-18 | Discharge: 2023-06-18 | Disposition: A | Discharge status: Home / Self Care | Source: Ambulatory Visit | Attending: Pulmonary Disease | Admitting: Pulmonary Disease

## 2023-06-18 VITALS — BP 140/72 | HR 90 | Ht 70.0 in | Wt 218.0 lb

## 2023-06-18 DIAGNOSIS — J449 Chronic obstructive pulmonary disease, unspecified: Secondary | ICD-10-CM | POA: Diagnosis present

## 2023-06-18 NOTE — Progress Notes (Signed)
 Ernest Mckay 78 y.o. male  Initial Psychosocial Assessment  Pt psychosocial assessment reveals pt lives with their spouse. Pt is currently retired. Pt hobbies include gardening. Pt reports his  stress level is moderate. Areas of stress/anxiety include  his to-do list .  Pt does exhibit signs of depression. Signs of depression include hopelessness and difficulty maintaining sleep. PHQ9 is 1/6. Pt shows good  coping skills with positive outlook . Offered emotional support and reassurance. Monitor and evaluate progress toward psychosocial goal(s).  Goal(s): Improved management of stress Improved coping skills Help patient work toward returning to meaningful activities that improve patient's QOL and are attainable with patient's lung disease  Ethelda Chick BS, ACSM-CEP 06/18/2023 12:01 PM

## 2023-06-18 NOTE — Progress Notes (Signed)
 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)  Patient's Home Medications on Admission:   Current Outpatient Medications:    albuterol (VENTOLIN HFA) 108 (90 Base) MCG/ACT inhaler, Inhale 2 puffs into the lungs every 6 (six) hours as needed for wheezing or shortness of breath., Disp: , Rfl:    hydrochlorothiazide (MICROZIDE) 12.5 MG capsule, Take 1 capsule (12.5 mg total) by mouth daily., Disp: 90 capsule, Rfl: 3   mometasone (ASMANEX) 220 MCG/ACT inhaler, Inhale 2 puffs into the lungs daily., Disp: , Rfl:    olopatadine (PATANOL) 0.1 % ophthalmic solution, Place 1 drop into both eyes 2 (two) times daily., Disp: , Rfl:    rosuvastatin (CRESTOR) 5 MG tablet, Take 5 mg by mouth daily., Disp: , Rfl:    Tiotropium Bromide-Olodaterol 2.5-2.5 MCG/ACT AERS, Inhale 2.5 mcg into the lungs daily. 2 puffs daily, Disp: , Rfl:    montelukast (SINGULAIR) 10 MG tablet, Take 1 tablet (10 mg total) by mouth at bedtime. (Patient not taking: Reported on 06/18/2023), Disp: 30 tablet, Rfl: 3   omeprazole (PRILOSEC) 40 MG capsule, Take 40 mg by mouth in the morning and at bedtime. 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   Steatosis of liver 06/15/2023    Tobacco Use: Social History   Tobacco Use  Smoking Status Former   Types: Cigars   Quit date: 10/11/1998   Years since quitting: 24.7  Smokeless Tobacco Current   Types: Snuff  Tobacco Comments   50 years of snuff use    Labs: Review Flowsheet  More data may exist      Latest Ref Rng & Units 10/02/2013 10/19/2016 10/10/2017 10/09/2018 10/08/2019  Labs for ITP Cardiac and Pulmonary Rehab  Cholestrol 100 - 199 mg/dL 782  956  213  086  578   LDL (calc) 0 - 99 mg/dL 469  629  89  528  52   HDL-C >39 mg/dL 80  71  88  62  413   Trlycerides 0 - 149 mg/dL 56  75  52  67  53   Hemoglobin A1c 4.0 - 5.6 % - - 5.4  5.7  5.3     Capillary Blood Glucose: No results found for: "GLUCAP"   Pulmonary Assessment Scores:  Pulmonary Assessment Scores     Row Name 06/18/23 1047         ADL UCSD   ADL Phase Entry     SOB Score total 37       CAT Score   CAT Score 19       mMRC Score   mMRC Score 1  UCSD: Self-administered rating of dyspnea associated with activities of daily living (ADLs) 6-point scale (0 = "not at all" to 5 = "maximal or unable to do because of breathlessness")  Scoring Scores range from 0 to 120.  Minimally important difference is 5 units  CAT: CAT can identify the health impairment of COPD patients and is better correlated with disease progression.  CAT has a scoring range of zero to 40. The CAT score is classified into four groups of low (less than 10), medium (10 - 20), high (21-30) and very high (31-40) based on the impact level of disease on health status. A CAT score over 10 suggests significant symptoms.  A worsening CAT score could be explained by an exacerbation, poor medication adherence, poor inhaler technique, or progression of COPD or comorbid conditions.  CAT MCID is 2 points  mMRC: mMRC (Modified Medical Research Council) Dyspnea Scale is used to assess the degree of  baseline functional disability in patients of respiratory disease due to dyspnea. No minimal important difference is established. A decrease in score of 1 point or greater is considered a positive change.   Pulmonary Function Assessment:   Exercise Target Goals: Exercise Program Goal: Individual exercise prescription set using results from initial 6 min walk test and THRR while considering  patient's activity barriers and safety.   Exercise Prescription Goal: Initial exercise prescription builds to 30-45 minutes a day of aerobic activity, 2-3 days per week.  Home exercise guidelines will be given to patient during program as part of exercise prescription that the participant will acknowledge.  Activity Barriers & Risk Stratification:  Activity Barriers & Cardiac Risk Stratification - 06/18/23 1052       Activity Barriers & Cardiac Risk Stratification   Activity Barriers Deconditioning;Muscular Weakness;Shortness of Breath;Back Problems;Arthritis    Cardiac Risk Stratification Moderate             6 Minute Walk:  6 Minute Walk     Row Name 06/18/23 1102 06/18/23 1130       6 Minute Walk   Phase -- Initial    Distance -- 1220 feet    Walk Time -- 6 minutes    # of Rest Breaks -- 0    MPH -- 2.31    METS -- 2.88    RPE -- 11    Perceived Dyspnea  -- 3    VO2 Peak -- 10.08    Symptoms -- No    Resting HR -- 90 bpm    Resting BP -- 140/72    Resting Oxygen Saturation  -- 97 %    Exercise Oxygen Saturation  during 6 min walk -- 97 %    Max Ex. HR -- 123 bpm    Max Ex. BP -- 176/70    2 Minute Post BP -- 142/70      Interval HR   1 Minute HR -- 110    2 Minute HR -- 118    3 Minute HR -- 119    4 Minute HR -- 118    5 Minute HR -- 120    6 Minute HR -- 123    2 Minute Post HR -- 95    Interval Heart Rate? -- Yes      Interval Oxygen   Interval Oxygen? -- Yes    Baseline Oxygen Saturation % -- 97 %    1 Minute Oxygen Saturation % -- 97 %    1 Minute Liters of  Oxygen -- 0  L    2 Minute Oxygen Saturation % -- 97 %    2 Minute Liters of Oxygen -- 0 L    3 Minute Oxygen Saturation % -- 97 %    3 Minute Liters of Oxygen -- 0 L    4 Minute Oxygen Saturation % -- 97 %    4 Minute Liters of Oxygen -- 0 L    5 Minute Oxygen Saturation % -- 97 %    5 Minute Liters of Oxygen -- 0 L    6 Minute Oxygen Saturation % -- 98 %    6 Minute Liters of Oxygen -- 0 L    2 Minute Post Oxygen Saturation % -- 98 %    2 Minute Post Liters of Oxygen -- 0 L             Oxygen Initial Assessment:  Oxygen Initial Assessment - 06/18/23 1046       Home Oxygen   Home Oxygen Device None    Sleep Oxygen Prescription None    Home Exercise Oxygen Prescription None    Compliance with Home Oxygen Use Yes      Initial 6 min Walk   Oxygen Used None      Program Oxygen Prescription   Program Oxygen Prescription None      Intervention   Short Term Goals To learn and understand importance of maintaining oxygen saturations>88%;To learn and demonstrate proper use of respiratory medications;To learn and understand importance of monitoring SPO2 with pulse oximeter and demonstrate accurate use of the pulse oximeter.;To learn and demonstrate proper pursed lip breathing techniques or other breathing techniques.     Long  Term Goals Verbalizes importance of monitoring SPO2 with pulse oximeter and return demonstration;Maintenance of O2 saturations>88%;Exhibits proper breathing techniques, such as pursed lip breathing or other method taught during program session;Demonstrates proper use of MDI's;Compliance with respiratory medication             Oxygen Re-Evaluation:   Oxygen Discharge (Final Oxygen Re-Evaluation):   Initial Exercise Prescription:  Initial Exercise Prescription - 06/18/23 1100       Date of Initial Exercise RX and Referring Provider   Date 06/18/23    Referring Provider [name]    Expected Discharge Date 09/13/23      Treadmill   MPH 2    Grade  0    Minutes 15    METs 2.5      Bike   Level 2    Minutes 15    METs 2.5      Prescription Details   Frequency (times per week) 2    Duration Progress to 30 minutes of continuous aerobic without signs/symptoms of physical distress      Intensity   THRR 40-80% of Max Heartrate 58-115    Ratings of Perceived Exertion 11-13    Perceived Dyspnea 0-4      Progression   Progression Continue to progress workloads to maintain intensity without signs/symptoms of physical distress.      Resistance Training   Training Prescription Yes    Weight black bands    Reps 10-15             Perform Capillary Blood Glucose checks as needed.  Exercise Prescription Changes:   Exercise Comments:   Exercise Goals and Review:   Exercise Goals     Row Name 06/18/23 1053             Exercise Goals   Increase Physical  Activity Yes       Intervention Provide advice, education, support and counseling about physical activity/exercise needs.;Develop an individualized exercise prescription for aerobic and resistive training based on initial evaluation findings, risk stratification, comorbidities and participant's personal goals.       Expected Outcomes Short Term: Attend rehab on a regular basis to increase amount of physical activity.;Long Term: Add in home exercise to make exercise part of routine and to increase amount of physical activity.;Long Term: Exercising regularly at least 3-5 days a week.       Increase Strength and Stamina Yes       Intervention Provide advice, education, support and counseling about physical activity/exercise needs.;Develop an individualized exercise prescription for aerobic and resistive training based on initial evaluation findings, risk stratification, comorbidities and participant's personal goals.       Expected Outcomes Short Term: Increase workloads from initial exercise prescription for resistance, speed, and METs.;Short Term: Perform resistance training  exercises routinely during rehab and add in resistance training at home;Long Term: Improve cardiorespiratory fitness, muscular endurance and strength as measured by increased METs and functional capacity ( )       Able to understand and use rate of perceived exertion (RPE) scale Yes       Intervention Provide education and explanation on how to use RPE scale       Expected Outcomes Short Term: Able to use RPE daily in rehab to express subjective intensity level;Long Term:  Able to use RPE to guide intensity level when exercising independently       Able to understand and use Dyspnea scale Yes       Intervention Provide education and explanation on how to use Dyspnea scale       Expected Outcomes Short Term: Able to use Dyspnea scale daily in rehab to express subjective sense of shortness of breath during exertion;Long Term: Able to use Dyspnea scale to guide intensity level when exercising independently       Knowledge and understanding of Target Heart Rate Range (THRR) Yes       Intervention Provide education and explanation of THRR including how the numbers were predicted and where they are located for reference       Expected Outcomes Short Term: Able to state/look up THRR;Long Term: Able to use THRR to govern intensity when exercising independently;Short Term: Able to use daily as guideline for intensity in rehab       Understanding of Exercise Prescription Yes       Intervention Provide education, explanation, and written materials on patient's individual exercise prescription       Expected Outcomes Short Term: Able to explain program exercise prescription;Long Term: Able to explain home exercise prescription to exercise independently                Exercise Goals Re-Evaluation :   Discharge Exercise Prescription (Final Exercise Prescription Changes):   Nutrition:  Target Goals: Understanding of nutrition guidelines, daily intake of sodium 1500mg , cholesterol 200mg , calories 30%  from fat and 7% or less from saturated fats, daily to have 5 or more servings of fruits and vegetables.  Biometrics:  Pre Biometrics - 06/18/23 1101       Pre Biometrics   Grip Strength 38 kg              Nutrition Therapy Plan and Nutrition Goals:   Nutrition Assessments:  MEDIFICTS Score Key: >=70 Need to make dietary changes  40-70 Heart Healthy Diet <= 40 Therapeutic Level  Cholesterol Diet   Picture Your Plate Scores: <29 Unhealthy dietary pattern with much room for improvement. 41-50 Dietary pattern unlikely to meet recommendations for good health and room for improvement. 51-60 More healthful dietary pattern, with some room for improvement.  >60 Healthy dietary pattern, although there may be some specific behaviors that could be improved.    Nutrition Goals Re-Evaluation:   Nutrition Goals Discharge (Final Nutrition Goals Re-Evaluation):   Psychosocial: Target Goals: Acknowledge presence or absence of significant depression and/or stress, maximize coping skills, provide positive support system. Participant is able to verbalize types and ability to use techniques and skills needed for reducing stress and depression.  Initial Review & Psychosocial Screening:  Initial Psych Review & Screening - 06/18/23 1041       Initial Review   Current issues with None Identified      Family Dynamics   Good Support System? Yes    Comments wife, Alona Bene      Barriers   Psychosocial barriers to participate in program The patient should benefit from training in stress management and relaxation.      Screening Interventions   Interventions Encouraged to exercise    Expected Outcomes Short Term goal: Utilizing psychosocial counselor, staff and physician to assist with identification of specific Stressors or current issues interfering with healing process. Setting desired goal for each stressor or current issue identified.;Long Term Goal: Stressors or current issues are  controlled or eliminated.;Short Term goal: Identification and review with participant of any Quality of Life or Depression concerns found by scoring the questionnaire.;Long Term goal: The participant improves quality of Life and PHQ9 Scores as seen by post scores and/or verbalization of changes             Quality of Life Scores:  Scores of 19 and below usually indicate a poorer quality of life in these areas.  A difference of  2-3 points is a clinically meaningful difference.  A difference of 2-3 points in the total score of the Quality of Life Index has been associated with significant improvement in overall quality of life, self-image, physical symptoms, and general health in studies assessing change in quality of life.  PHQ-9: Review Flowsheet  More data may exist      06/18/2023 10/08/2019 10/09/2018 10/10/2017 10/19/2016  Depression screen PHQ 2/9  Decreased Interest 0 1 0 0 0 0  Down, Depressed, Hopeless 1 0 0 0 0 0  PHQ - 2 Score 1 1 0 0 0 0  Altered sleeping 1 - - - -  Tired, decreased energy 2 - - - -  Change in appetite 0 - - - -  Feeling bad or failure about yourself  0 - - - -  Trouble concentrating 1 - - - -  Moving slowly or fidgety/restless 1 - - - -  Suicidal thoughts 0 - - - -  PHQ-9 Score 6 - - - -  Difficult doing work/chores Somewhat difficult - - - -    Details       Multiple values from one day are sorted in reverse-chronological order        Interpretation of Total Score  Total Score Depression Severity:  1-4 = Minimal depression, 5-9 = Mild depression, 10-14 = Moderate depression, 15-19 = Moderately severe depression, 20-27 = Severe depression   Psychosocial Evaluation and Intervention:  Psychosocial Evaluation - 06/18/23 1044       Psychosocial Evaluation & Interventions   Interventions Encouraged to exercise with the program and  follow exercise prescription    Comments only stress is when he doesn't get his to-do list done    Expected Outcomes  For pt to participate in PR    Continue Psychosocial Services  No Follow up required             Psychosocial Re-Evaluation:   Psychosocial Discharge (Final Psychosocial Re-Evaluation):   Education: Education Goals: Education classes will be provided on a weekly basis, covering required topics. Participant will state understanding/return demonstration of topics presented.  Learning Barriers/Preferences:  Learning Barriers/Preferences - 06/18/23 1045       Learning Barriers/Preferences   Learning Barriers None    Learning Preferences Skilled Demonstration             Education Topics: Know Your Numbers Group instruction that is supported by a PowerPoint presentation. Instructor discusses importance of knowing and understanding resting, exercise, and post-exercise oxygen saturation, heart rate, and blood pressure. Oxygen saturation, heart rate, blood pressure, rating of perceived exertion, and dyspnea are reviewed along with a normal range for these values.    Exercise for the Pulmonary Patient Group instruction that is supported by a PowerPoint presentation. Instructor discusses benefits of exercise, core components of exercise, frequency, duration, and intensity of an exercise routine, importance of utilizing pulse oximetry during exercise, safety while exercising, and options of places to exercise outside of rehab.    MET Level  Group instruction provided by PowerPoint, verbal discussion, and written material to support subject matter. Instructor reviews what METs are and how to increase METs.    Pulmonary Medications Verbally interactive group education provided by instructor with focus on inhaled medications and proper administration.   Anatomy and Physiology of the Respiratory System Group instruction provided by PowerPoint, verbal discussion, and written material to support subject matter. Instructor reviews respiratory cycle and anatomical components of the  respiratory system and their functions. Instructor also reviews differences in obstructive and restrictive respiratory diseases with examples of each.    Oxygen Safety Group instruction provided by PowerPoint, verbal discussion, and written material to support subject matter. There is an overview of "What is Oxygen" and "Why do we need it".  Instructor also reviews how to create a safe environment for oxygen use, the importance of using oxygen as prescribed, and the risks of noncompliance. There is a brief discussion on traveling with oxygen and resources the patient may utilize.   Oxygen Use Group instruction provided by PowerPoint, verbal discussion, and written material to discuss how supplemental oxygen is prescribed and different types of oxygen supply systems. Resources for more information are provided.    Breathing Techniques Group instruction that is supported by demonstration and informational handouts. Instructor discusses the benefits of pursed lip and diaphragmatic breathing and detailed demonstration on how to perform both.     Risk Factor Reduction Group instruction that is supported by a PowerPoint presentation. Instructor discusses the definition of a risk factor, different risk factors for pulmonary disease, and how the heart and lungs work together.   Pulmonary Diseases Group instruction provided by PowerPoint, verbal discussion, and written material to support subject matter. Instructor gives an overview of the different type of pulmonary diseases. There is also a discussion on risk factors and symptoms as well as ways to manage the diseases.   Stress and Energy Conservation Group instruction provided by PowerPoint, verbal discussion, and written material to support subject matter. Instructor gives an overview of stress and the impact it can have on the body. Instructor also reviews  ways to reduce stress. There is also a discussion on energy conservation and ways to  conserve energy throughout the day.   Warning Signs and Symptoms Group instruction provided by PowerPoint, verbal discussion, and written material to support subject matter. Instructor reviews warning signs and symptoms of stroke, heart attack, cold and flu. Instructor also reviews ways to prevent the spread of infection.   Other Education Group or individual verbal, written, or video instructions that support the educational goals of the pulmonary rehab program.    Knowledge Questionnaire Score:  Knowledge Questionnaire Score - 06/18/23 1142       Knowledge Questionnaire Score   Pre Score 14/18             Core Components/Risk Factors/Patient Goals at Admission:  Personal Goals and Risk Factors at Admission - 06/18/23 1045       Core Components/Risk Factors/Patient Goals on Admission   Improve shortness of breath with ADL's Yes    Intervention Provide education, individualized exercise plan and daily activity instruction to help decrease symptoms of SOB with activities of daily living.    Expected Outcomes Short Term: Improve cardiorespiratory fitness to achieve a reduction of symptoms when performing ADLs;Long Term: Be able to perform more ADLs without symptoms or delay the onset of symptoms             Core Components/Risk Factors/Patient Goals Review:    Core Components/Risk Factors/Patient Goals at Discharge (Final Review):    ITP Comments:   Comments: Dr. Mechele Collin is Medical Director for Pulmonary Rehab at [facility].

## 2023-06-18 NOTE — Progress Notes (Signed)
 Ernest Mckay 78 y.o. male Pulmonary Rehab Orientation Note This patient who was referred to Pulmonary Rehab by [facility] Char[facility] ([facility]) with the diagnosis of COPD arrived today in Cardiac and Pulmonary Rehab. He arrived ambulatory with normal gait. He does not carry portable oxygen.   Color good, skin warm and dry. Patient is oriented to time and place. Patient's medical history, psychosocial health, and medications reviewed. Psychosocial assessment reveals patient lives with spouse. Ernest Mckay is currently retired. Patient hobbies include gardening. Patient reports his stress level is moderate. Areas of stress/anxiety include  not getting his to-do list done . Patient does not exhibit signs of depression. PHQ2/9 score 2/6. Ernest Mckay shows good  coping skills with positive outlook on life. He sts he has times of feeling down but he focuses on being grateful.  Offered emotional support and reassurance. Will continue to monitor.   Physical assessment performed by Essie Hart RN. Please see their orientation physical assessment note.   Ernest Mckay reports she does take medications as prescribed. Patient states he  follows a regular  diet. The patient reports no specific efforts to gain or lose weight.. Patient's weight will be monitored closely.   Demonstration and practice of PLB using pulse oximeter. Ernest Mckay able to return demonstration satisfactorily. Safety and hand hygiene in the exercise area reviewed with patient. Ernest Mckay voices understanding of the information reviewed. Department expectations discussed with patient and achievable goals were set. The patient shows enthusiasm about attending the program and we look forward to working with Ernest Mckay. Ernest Mckay completed a 6 min walk test today and is scheduled to begin exercise on 06/26/23 at 8:15 am.  [id number] Ernest Masson, MS, ACSM-CEP

## 2023-06-18 NOTE — Progress Notes (Signed)
 Pulmonary Rehab Orientation Physical Assessment Note  Physical assessment reveals patient is alert and oriented x 4. Heart rate is normal, breath sounds clear to auscultation, no wheezes, rales, or rhonchi. Reports denies chronic cough. Bowel sounds present x4 quads. Pt denies abdominal discomfort, nausea, vomiting, diarrhea or constipation. Grip strength equal, strong. Distal pulses +2; no swelling to lower extremities.   Essie Hart, RN, BSN

## 2023-06-26 ENCOUNTER — Encounter (HOSPITAL_COMMUNITY)
Admission: RE | Admit: 2023-06-26 | Discharge: 2023-06-26 | Disposition: A | Discharge status: Home / Self Care | Source: Ambulatory Visit | Attending: Pulmonary Disease | Admitting: Pulmonary Disease

## 2023-06-26 VITALS — Wt 217.8 lb

## 2023-06-26 DIAGNOSIS — J449 Chronic obstructive pulmonary disease, unspecified: Secondary | ICD-10-CM

## 2023-06-26 NOTE — Progress Notes (Signed)
 Daily Session Note  Patient Details  Name: Ernest Mckay MRN: [id number] Date of Birth: 04/30/1945 Referring Provider:   Doristine Devoid Pulmonary Rehab Walk Test from 06/18/2023 in [facility]  Referring Provider [name]       Encounter Date: 06/26/2023  Check In:  Session Check In - 06/26/23 2130       Check-In   Supervising physician immediately available to respond to emergencies [facility] MD immediately available    Physician(s) Jari Favre, NP    Location [facility]    Staff Present Elissa Lovett BS, ACSM-CEP, Exercise Physiologist;Shawnta Schlegel Earlene Plater, MS, ACSM-CEP, Exercise Physiologist;Casey Synthia Innocent, RN, BSN    Virtual Visit No    Medication changes reported     Yes    Comments Cetirizine 10mg , Fluticasone were added    Fall or balance concerns reported    No    Tobacco Cessation No Change    Warm-up and Cool-down Performed as group-led instruction   orientation only   Resistance Training Performed Yes    VAD Patient? No    PAD/SET Patient? No      Pain Assessment   Currently in Pain? No/denies    Multiple Pain Sites No             Capillary Blood Glucose: No results found for this or any previous visit (from the past 24 hours).   Exercise Prescription Changes - 06/26/23 0900       Response to Exercise   Blood Pressure (Admit) 130/68    Blood Pressure (Exercise) 154/80    Blood Pressure (Exit) 112/68    Heart Rate (Admit) 92 bpm    Heart Rate (Exercise) 124 bpm    Heart Rate (Exit) 100 bpm    Oxygen Saturation (Admit) 99 %    Oxygen Saturation (Exercise) 98 %    Oxygen Saturation (Exit) 97 %    Rating of Perceived Exertion (Exercise) 14    Perceived Dyspnea (Exercise) 2    Duration Progress to 30 minutes of  aerobic without signs/symptoms of physical distress    Intensity THRR unchanged      Progression   Progression Continue to progress workloads to maintain intensity  without signs/symptoms of physical distress.      Resistance Training   Training Prescription Yes    Weight black bands    Reps 10-15    Time 10 Minutes      Treadmill   MPH 1.6    Grade 0    Minutes 15    METs 2.1      Bike   Level 2    Minutes 15    METs 2.2             Social History   Tobacco Use  Smoking Status Former   Types: Cigars   Quit date: 10/11/1998   Years since quitting: 24.7  Smokeless Tobacco Current   Types: Snuff  Tobacco Comments   50 years of snuff use    Goals Met:  Proper associated with RPD/PD & O2 Sat Exercise tolerated well No report of concerns or symptoms today Strength training completed today  Goals Unmet:  Not Applicable  Comments: Service time is from 0814 to 0932.    Dr. Mechele Collin is Medical Director for Pulmonary Rehab at [facility].

## 2023-06-27 ENCOUNTER — Telehealth (HOSPITAL_COMMUNITY): Payer: Self-pay

## 2023-06-27 NOTE — Telephone Encounter (Signed)
 Called pt to let him know that PR class will begin at 8:30 on 06/28/23.

## 2023-06-28 ENCOUNTER — Encounter (HOSPITAL_COMMUNITY)
Admission: RE | Admit: 2023-06-28 | Discharge: 2023-06-28 | Disposition: A | Discharge status: Home / Self Care | Source: Ambulatory Visit | Attending: Pulmonary Disease | Admitting: Pulmonary Disease

## 2023-06-28 DIAGNOSIS — J449 Chronic obstructive pulmonary disease, unspecified: Secondary | ICD-10-CM

## 2023-06-28 NOTE — Progress Notes (Signed)
 Daily Session Note  Patient Details  Name: Ernest Mckay MRN: [id number] Date of Birth: 05-Jan-1946 Referring Provider:   Doristine Devoid Pulmonary Rehab Walk Test from 06/18/2023 in [facility]  Referring Provider [name]       Encounter Date: 06/28/2023  Check In:  Session Check In - 06/28/23 0915       Check-In   Supervising physician immediately available to respond to emergencies [facility] MD immediately available    Physician(s) Carlos Levering, NP    [facility]    Staff Present Elissa Lovett BS, ACSM-CEP, Exercise Physiologist;Kiely Cousar Earlene Plater, MS, ACSM-CEP, Exercise Physiologist;Casey Synthia Innocent, RN, BSN    Virtual Visit No    Medication changes reported     No    Fall or balance concerns reported    No    Tobacco Cessation No Change    Warm-up and Cool-down Performed as group-led instruction    Resistance Training Performed Yes    VAD Patient? No    PAD/SET Patient? No      Pain Assessment   Currently in Pain? No/denies    Multiple Pain Sites No             Capillary Blood Glucose: No results found for this or any previous visit (from the past 24 hours).    Social History   Tobacco Use  Smoking Status Former   Types: Cigars   Quit date: 10/11/1998   Years since quitting: 24.7  Smokeless Tobacco Current   Types: Snuff  Tobacco Comments   50 years of snuff use    Goals Met:  Proper associated with RPD/PD & O2 Sat Exercise tolerated well No report of concerns or symptoms today Strength training completed today  Goals Unmet:  Not Applicable  Comments: Service time is from 0839 to 0945.    Dr. Mechele Collin is Medical Director for Pulmonary Rehab at [facility].

## 2023-07-03 ENCOUNTER — Encounter (HOSPITAL_COMMUNITY)
Admission: RE | Admit: 2023-07-03 | Discharge: 2023-07-03 | Disposition: A | Discharge status: Home / Self Care | Source: Ambulatory Visit | Attending: Pulmonary Disease

## 2023-07-03 DIAGNOSIS — J449 Chronic obstructive pulmonary disease, unspecified: Secondary | ICD-10-CM

## 2023-07-03 NOTE — Progress Notes (Signed)
 Daily Session Note  Patient Details  Name: Ernest Mckay MRN: [id number] Date of Birth: 1945-05-27 Referring Provider:   Doristine Devoid Pulmonary Rehab Walk Test from 06/18/2023 in [facility]  Referring Provider [name]       Encounter Date: 07/03/2023  Check In:  Session Check In - 07/03/23 4098       Check-In   Supervising physician immediately available to respond to emergencies [facility] immediately available    Physician(s) Rise Paganini, NP    [facility]    Staff Present Elissa Lovett BS, ACSM-CEP, Exercise Physiologist;Kaylee Earlene Plater, MS, ACSM-CEP, Exercise Physiologist;Valen Gillison Synthia Innocent, RN, BSN    Virtual Visit No    Medication changes reported     No    Fall or balance concerns reported    No    Tobacco Cessation No Change    Warm-up and Cool-down Performed as group-led instruction    Resistance Training Performed Yes    VAD Patient? No    PAD/SET Patient? No      Pain Assessment   Currently in Pain? No/denies    Multiple Pain Sites No             Capillary Blood Glucose: No results found for this or any previous visit (from the past 24 hours).    Social History   Tobacco Use  Smoking Status Former   Types: Cigars   Quit date: 10/11/1998   Years since quitting: 24.7  Smokeless Tobacco Current   Types: Snuff  Tobacco Comments   50 years of snuff use    Goals Met:  Proper associated with RPD/PD & O2 Sat Independence with exercise equipment Exercise tolerated well No report of concerns or symptoms today Strength training completed today  Goals Unmet:  Not Applicable  Comments: Service time is from 0809 to [phone].    Dr. Mechele Collin is Medical Director for Pulmonary Rehab at [facility].

## 2023-07-05 ENCOUNTER — Encounter (HOSPITAL_COMMUNITY)
Admission: RE | Admit: 2023-07-05 | Discharge: 2023-07-05 | Disposition: A | Discharge status: Home / Self Care | Source: Ambulatory Visit | Attending: Pulmonary Disease

## 2023-07-05 DIAGNOSIS — J449 Chronic obstructive pulmonary disease, unspecified: Secondary | ICD-10-CM

## 2023-07-05 NOTE — Progress Notes (Signed)
 Daily Session Note  Patient Details  Name: Ernest Mckay MRN: [id number] Date of Birth: 1946-01-14 Referring Provider:   Doristine Devoid Pulmonary Rehab Walk Test from 06/18/2023 in [facility]  Referring Provider [name]       Encounter Date: 07/05/2023  Check In:  Session Check In - 07/05/23 4098       Check-In   Supervising physician immediately available to respond to emergencies [facility] immediately available    Physician(s) Robin Searing, NP    [facility]    Staff Present Elissa Lovett BS, ACSM-CEP, Exercise Physiologist;Kaylee Earlene Plater, MS, ACSM-CEP, Exercise Physiologist;Casey Synthia Innocent, RN, BSN    Virtual Visit No    Medication changes reported     No    Fall or balance concerns reported    No    Tobacco Cessation No Change    Warm-up and Cool-down Performed as group-led instruction    Resistance Training Performed Yes    VAD Patient? No    PAD/SET Patient? No      Pain Assessment   Currently in Pain? No/denies    Multiple Pain Sites No             Capillary Blood Glucose: No results found for this or any previous visit (from the past 24 hours).    Social History   Tobacco Use  Smoking Status Former   Types: Cigars   Quit date: 10/11/1998   Years since quitting: 24.7  Smokeless Tobacco Current   Types: Snuff  Tobacco Comments   50 years of snuff use    Goals Met:  Exercise tolerated well No report of concerns or symptoms today Strength training completed today  Goals Unmet:  Not Applicable  Comments: Service time is from 0805 to 0931.    Dr. Mechele Collin is Medical Director for Pulmonary Rehab at [facility].

## 2023-07-10 ENCOUNTER — Encounter (HOSPITAL_COMMUNITY)
Admission: RE | Admit: 2023-07-10 | Discharge: 2023-07-10 | Disposition: A | Discharge status: Home / Self Care | Source: Ambulatory Visit | Attending: Pulmonary Disease | Admitting: Pulmonary Disease

## 2023-07-10 VITALS — Wt 217.8 lb

## 2023-07-10 DIAGNOSIS — J449 Chronic obstructive pulmonary disease, unspecified: Secondary | ICD-10-CM | POA: Diagnosis not present

## 2023-07-10 NOTE — Progress Notes (Signed)
 Daily Session Note  Patient Details  Name: Ernest Mckay MRN: [id number] Date of Birth: April 21, 1945 Referring Provider:   Doristine Devoid Pulmonary Rehab Walk Test from 06/18/2023 in [facility]  Referring Provider [name]       Encounter Date: 07/10/2023  Check In:  Session Check In - 07/10/23 0824       Check-In   Supervising physician immediately available to respond to emergencies [facility] immediately available    Physician(s) Marylene Land, NP    Location [facility]    Staff Present Elissa Lovett BS, ACSM-CEP, Exercise Physiologist;Maximina Pirozzi Earlene Plater, MS, ACSM-CEP, Exercise Physiologist;Casey Synthia Innocent, RN, BSN    Virtual Visit No    Medication changes reported     No    Fall or balance concerns reported    No    Tobacco Cessation No Change    Warm-up and Cool-down Performed as group-led instruction    Resistance Training Performed Yes    VAD Patient? No    PAD/SET Patient? No      Pain Assessment   Currently in Pain? No/denies    Multiple Pain Sites No             Capillary Blood Glucose: No results found for this or any previous visit (from the past 24 hours).   Exercise Prescription Changes - 07/10/23 0900       Response to Exercise   Blood Pressure (Admit) 132/70    Blood Pressure (Exercise) 148/70    Blood Pressure (Exit) 120/70    Heart Rate (Admit) 93 bpm    Heart Rate (Exercise) 114 bpm    Heart Rate (Exit) 100 bpm    Oxygen Saturation (Admit) 97 %    Oxygen Saturation (Exercise) 97 %    Oxygen Saturation (Exit) 97 %    Rating of Perceived Exertion (Exercise) 13    Perceived Dyspnea (Exercise) 1    Duration Continue with 30 min of aerobic exercise without signs/symptoms of physical distress.    Intensity THRR unchanged      Progression   Progression Continue to progress workloads to maintain intensity without signs/symptoms of physical distress.      Resistance  Training   Training Prescription Yes    Weight black bands    Reps 10-15    Time 10 Minutes      Bike   Level 3    Watts 60    Minutes 15    METs 3      Track   Laps 12    Minutes 15    METs 3.31             Social History   Tobacco Use  Smoking Status Former   Types: Cigars   Quit date: 10/11/1998   Years since quitting: 24.7  Smokeless Tobacco Current   Types: Snuff  Tobacco Comments   50 years of snuff use    Goals Met:  Proper associated with RPD/PD & O2 Sat Exercise tolerated well No report of concerns or symptoms today Strength training completed today  Goals Unmet:  Not Applicable  Comments: Service time is from 0806 to 0924.    Dr. Mechele Collin is Medical Director for Pulmonary Rehab at [facility].

## 2023-07-11 NOTE — Progress Notes (Signed)
 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 (Patient not taking: Reported on 06/18/2023), Disp: 30 tablet, Rfl: 3   olopatadine (PATANOL) 0.1 % ophthalmic solution, Place 1 drop into both eyes 2 (two) times daily., Disp: , Rfl:    omeprazole (PRILOSEC) 40 MG capsule, Take 40 mg by mouth in the morning and at bedtime. (Patient not taking: Reported on 06/18/2023), Disp: , Rfl:    rosuvastatin (CRESTOR) 5 MG tablet, Take 5 mg by mouth daily., Disp: , Rfl:    Tiotropium Bromide-Olodaterol 2.5-2.5 MCG/ACT AERS, Inhale 2.5 mcg into the lungs daily. 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   Steatosis of liver 06/15/2023    Tobacco Use: Social History   Tobacco Use  Smoking Status Former   Types: Cigars   Quit date: 10/11/1998   Years since quitting: 24.7  Smokeless Tobacco Current   Types: Snuff  Tobacco Comments   50 years of snuff use    Labs: Review Flowsheet  More data may exist      Latest Ref Rng & Units 10/02/2013 10/19/2016 10/10/2017 10/09/2018 10/08/2019  Labs for ITP Cardiac and Pulmonary Rehab  Cholestrol 100 - 199 mg/dL 409  811  914  782  956   LDL (calc) 0 - 99 mg/dL 213  086  89  578  52   HDL-C >39 mg/dL 80  71  88  62  469   Trlycerides 0 - 149 mg/dL 56  75  52  67  53   Hemoglobin A1c 4.0 - 5.6 % - - 5.4  5.7  5.3     Capillary Blood Glucose: No results found for: "GLUCAP"   Pulmonary Assessment Scores:  Pulmonary Assessment Scores     Row Name 06/18/23 1047         ADL UCSD   ADL Phase Entry     SOB Score total 37       CAT Score   CAT Score 19       mMRC Score   mMRC Score 1  UCSD: Self-administered rating of dyspnea associated with activities of daily living (ADLs) 6-point scale (0 = "not at all" to 5 = "maximal or unable to do because of breathlessness")  Scoring Scores range from 0 to 120.  Minimally important difference is 5 units  CAT: CAT can identify the health impairment of COPD patients and is better correlated with disease progression.  CAT has a scoring range of zero to 40. The CAT score is classified into four groups of low (less than 10), medium (10 - 20), high (21-30) and very high (31-40) based on the impact level of disease on health status. A CAT score over 10 suggests significant symptoms.  A worsening CAT score could be explained by an exacerbation, poor medication adherence, poor inhaler technique, or progression of COPD or comorbid conditions.  CAT MCID is 2 points  mMRC: mMRC (Modified Medical Research Council) Dyspnea Scale is used to assess the degree of  baseline functional disability in patients of respiratory disease due to dyspnea. No minimal important difference is established. A decrease in score of 1 point or greater is considered a positive change.   Pulmonary Function Assessment:   Exercise Target Goals: Exercise Program Goal: Individual exercise prescription set using results from initial 6 min walk test and THRR while considering  patient's activity barriers and safety.   Exercise Prescription Goal: Initial exercise prescription builds to 30-45 minutes a day of aerobic activity, 2-3 days per week.  Home exercise guidelines will be given to patient during program as part of exercise prescription that the participant will acknowledge.  Activity Barriers & Risk Stratification:  Activity Barriers & Cardiac Risk Stratification - 06/18/23 1052       Activity Barriers & Cardiac Risk Stratification   Activity Barriers Deconditioning;Muscular Weakness;Shortness of Breath;Back Problems;Arthritis    Cardiac Risk Stratification Moderate             6 Minute Walk:  6 Minute Walk     Row Name 06/18/23 1102 06/18/23 1130       6 Minute Walk   Phase -- Initial    Distance -- 1220 feet    Walk Time -- 6 minutes    # of Rest Breaks -- 0    MPH -- 2.31    METS -- 2.88    RPE -- 11    Perceived Dyspnea  -- 3    VO2 Peak -- 10.08    Symptoms -- No    Resting HR -- 90 bpm    Resting BP -- 140/72    Resting Oxygen Saturation  -- 97 %    Exercise Oxygen Saturation  during 6 min walk -- 97 %    Max Ex. HR -- 123 bpm    Max Ex. BP -- 176/70    2 Minute Post BP -- 142/70      Interval HR   1 Minute HR -- 110    2 Minute HR -- 118    3 Minute HR -- 119    4 Minute HR -- 118    5 Minute HR -- 120    6 Minute HR -- 123    2 Minute Post HR -- 95    Interval Heart Rate? -- Yes      Interval Oxygen   Interval Oxygen? -- Yes    Baseline Oxygen Saturation % -- 97 %    1 Minute Oxygen Saturation % -- 97 %    1 Minute Liters of  Oxygen -- 0  L    2 Minute Oxygen Saturation % -- 97 %    2 Minute Liters of Oxygen -- 0 L    3 Minute Oxygen Saturation % -- 97 %    3 Minute Liters of Oxygen -- 0 L    4 Minute Oxygen Saturation % -- 97 %    4 Minute Liters of Oxygen -- 0 L    5 Minute Oxygen Saturation % -- 97 %    5 Minute Liters of Oxygen -- 0 L    6 Minute Oxygen Saturation % -- 98 %    6 Minute Liters of Oxygen -- 0 L    2 Minute Post Oxygen Saturation % -- 98 %    2 Minute Post Liters of Oxygen -- 0 L             Oxygen Initial Assessment:  Oxygen Initial Assessment - 06/18/23 1046       Home Oxygen   Home Oxygen Device None    Sleep Oxygen Prescription None    Home Exercise Oxygen Prescription None    Compliance with Home Oxygen Use Yes      Initial 6 min Walk   Oxygen Used None      Program Oxygen Prescription   Program Oxygen Prescription None      Intervention   Short Term Goals To learn and understand importance of maintaining oxygen saturations>88%;To learn and demonstrate proper use of respiratory medications;To learn and understand importance of monitoring SPO2 with pulse oximeter and demonstrate accurate use of the pulse oximeter.;To learn and demonstrate proper pursed lip breathing techniques or other breathing techniques.     Long  Term Goals Verbalizes importance of monitoring SPO2 with pulse oximeter and return demonstration;Maintenance of O2 saturations>88%;Exhibits proper breathing techniques, such as pursed lip breathing or other method taught during program session;Demonstrates proper use of MDI's;Compliance with respiratory medication             Oxygen Re-Evaluation:  Oxygen Re-Evaluation     Row Name 07/02/23 0950             Program Oxygen Prescription   Program Oxygen Prescription None         Home Oxygen   Home Oxygen Device None       Sleep Oxygen Prescription None       Home Exercise Oxygen Prescription None       Compliance with Home Oxygen Use Yes          Goals/Expected Outcomes   Short Term Goals To learn and understand importance of maintaining oxygen saturations>88%;To learn and demonstrate proper use of respiratory medications;To learn and understand importance of monitoring SPO2 with pulse oximeter and demonstrate accurate use of the pulse oximeter.;To learn and demonstrate proper pursed lip breathing techniques or other breathing techniques.        Long  Term Goals Verbalizes importance of monitoring SPO2 with pulse oximeter and return demonstration;Maintenance of O2 saturations>88%;Exhibits proper breathing techniques, such as pursed lip breathing or other method taught during program session;Demonstrates proper use of MDI's;Compliance with respiratory medication       Goals/Expected Outcomes Compliance and understanding of oxygen saturation monitoring and breathing techniques to decrease shortness of breath.                Oxygen Discharge (Final Oxygen Re-Evaluation):  Oxygen Re-Evaluation - 07/02/23 0950       Program Oxygen Prescription   Program Oxygen Prescription None      Home  Oxygen   Home Oxygen Device None    Sleep Oxygen Prescription None    Home Exercise Oxygen Prescription None    Compliance with Home Oxygen Use Yes      Goals/Expected Outcomes   Short Term Goals To learn and understand importance of maintaining oxygen saturations>88%;To learn and demonstrate proper use of respiratory medications;To learn and understand importance of monitoring SPO2 with pulse oximeter and demonstrate accurate use of the pulse oximeter.;To learn and demonstrate proper pursed lip breathing techniques or other breathing techniques.     Long  Term Goals Verbalizes importance of monitoring SPO2 with pulse oximeter and return demonstration;Maintenance of O2 saturations>88%;Exhibits proper breathing techniques, such as pursed lip breathing or other method taught during program session;Demonstrates proper use of MDI's;Compliance with  respiratory medication    Goals/Expected Outcomes Compliance and understanding of oxygen saturation monitoring and breathing techniques to decrease shortness of breath.             Initial Exercise Prescription:  Initial Exercise Prescription - 06/18/23 1100       Date of Initial Exercise RX and Referring Provider   Date 06/18/23    Referring Provider [name]    Expected Discharge Date 09/13/23      Treadmill   MPH 2    Grade 0    Minutes 15    METs 2.5      Bike   Level 2    Minutes 15    METs 2.5      Prescription Details   Frequency (times per week) 2    Duration Progress to 30 minutes of continuous aerobic without signs/symptoms of physical distress      Intensity   THRR 40-80% of Max Heartrate 58-115    Ratings of Perceived Exertion 11-13    Perceived Dyspnea 0-4      Progression   Progression Continue to progress workloads to maintain intensity without signs/symptoms of physical distress.      Resistance Training   Training Prescription Yes    Weight black bands    Reps 10-15             Perform Capillary Blood Glucose checks as needed.  Exercise Prescription Changes:   Exercise Prescription Changes     Row Name 06/26/23 0900 07/10/23 0900           Response to Exercise   Blood Pressure (Admit) 130/68 132/70      Blood Pressure (Exercise) 154/80 148/70      Blood Pressure (Exit) 112/68 120/70      Heart Rate (Admit) 92 bpm 93 bpm      Heart Rate (Exercise) 124 bpm 114 bpm      Heart Rate (Exit) 100 bpm 100 bpm      Oxygen Saturation (Admit) 99 % 97 %      Oxygen Saturation (Exercise) 98 % 97 %      Oxygen Saturation (Exit) 97 % 97 %      Rating of Perceived Exertion (Exercise) 14 13      Perceived Dyspnea (Exercise) 2 1      Duration Progress to 30 minutes of  aerobic without signs/symptoms of physical distress Continue with 30 min of aerobic exercise without signs/symptoms of physical distress.      Intensity THRR unchanged THRR  unchanged        Progression   Progression Continue to progress workloads to maintain intensity without signs/symptoms of physical distress. Continue to progress workloads to maintain intensity without signs/symptoms  of physical distress.        Resistance Training   Training Prescription Yes Yes      Weight black bands black bands      Reps 10-15 10-15      Time 10 Minutes 10 Minutes        Treadmill   MPH 1.6 --      Grade 0 --      Minutes 15 --      METs 2.1 --        Bike   Level 2 3      Watts -- 60      Minutes 15 15      METs 2.2 3        Track   Laps -- 12      Minutes -- 15      METs -- 3.31               Exercise Comments:   Exercise Comments     Row Name 06/26/23 1638           Exercise Comments Ernest Mckay completed his first day of exercise. He exercised for 15 min on the treadmill and upright bike. Pt averaged 2.1 METs at 1.6 mph on the treadmill and 2.2 METs at level 2 on the upright bike. He performed the warmup and cooldown standing with some struggle. Will have verbal cues next time. I am also considering transfering him to the track. Ernest Mckay looks somewhat unsteady on the treadmill.                Exercise Goals and Review:   Exercise Goals     Row Name 06/18/23 1053             Exercise Goals   Increase Physical Activity Yes       Intervention Provide advice, education, support and counseling about physical activity/exercise needs.;Develop an individualized exercise prescription for aerobic and resistive training based on initial evaluation findings, risk stratification, comorbidities and participant's personal goals.       Expected Outcomes Short Term: Attend rehab on a regular basis to increase amount of physical activity.;Long Term: Add in home exercise to make exercise part of routine and to increase amount of physical activity.;Long Term: Exercising regularly at least 3-5 days a week.       Increase Strength and Stamina Yes        Intervention Provide advice, education, support and counseling about physical activity/exercise needs.;Develop an individualized exercise prescription for aerobic and resistive training based on initial evaluation findings, risk stratification, comorbidities and participant's personal goals.       Expected Outcomes Short Term: Increase workloads from initial exercise prescription for resistance, speed, and METs.;Short Term: Perform resistance training exercises routinely during rehab and add in resistance training at home;Long Term: Improve cardiorespiratory fitness, muscular endurance and strength as measured by increased METs and functional capacity ( )       Able to understand and use rate of perceived exertion (RPE) scale Yes       Intervention Provide education and explanation on how to use RPE scale       Expected Outcomes Short Term: Able to use RPE daily in rehab to express subjective intensity level;Long Term:  Able to use RPE to guide intensity level when exercising independently       Able to understand and use Dyspnea scale Yes       Intervention Provide education and explanation on how to use Dyspnea  scale       Expected Outcomes Short Term: Able to use Dyspnea scale daily in rehab to express subjective sense of shortness of breath during exertion;Long Term: Able to use Dyspnea scale to guide intensity level when exercising independently       Knowledge and understanding of Target Heart Rate Range (THRR) Yes       Intervention Provide education and explanation of THRR including how the numbers were predicted and where they are located for reference       Expected Outcomes Short Term: Able to state/look up THRR;Long Term: Able to use THRR to govern intensity when exercising independently;Short Term: Able to use daily as guideline for intensity in rehab       Understanding of Exercise Prescription Yes       Intervention Provide education, explanation, and written materials on patient's  individual exercise prescription       Expected Outcomes Short Term: Able to explain program exercise prescription;Long Term: Able to explain home exercise prescription to exercise independently                Exercise Goals Re-Evaluation :  Exercise Goals Re-Evaluation     Row Name 07/02/23 0948             Exercise Goal Re-Evaluation   Exercise Goals Review Increase Physical Activity;Able to understand and use Dyspnea scale;Understanding of Exercise Prescription;Increase Strength and Stamina;Knowledge and understanding of Target Heart Rate Range (THRR);Able to understand and use rate of perceived exertion (RPE) scale       Comments Ernest Mckay has completed 2 exercise sessions. He exercises for 15 on the track and upright bike. Ernest Mckay averages 2.69 METs on the track and 2.8 METs at level 2 on the upright bike. He performs the warmup and cooldown standing with some struggle. Ernest Mckay was on the treadmill but had to be transferred to the track due to gait instability. Will continue to monitor and progress as able.       Expected Outcomes Through exercise at rehab and home, the patient will decrease shortness of breath with daily activities and feel confident in carrying out an exercise regimen at home.                Discharge Exercise Prescription (Final Exercise Prescription Changes):  Exercise Prescription Changes - 07/10/23 0900       Response to Exercise   Blood Pressure (Admit) 132/70    Blood Pressure (Exercise) 148/70    Blood Pressure (Exit) 120/70    Heart Rate (Admit) 93 bpm    Heart Rate (Exercise) 114 bpm    Heart Rate (Exit) 100 bpm    Oxygen Saturation (Admit) 97 %    Oxygen Saturation (Exercise) 97 %    Oxygen Saturation (Exit) 97 %    Rating of Perceived Exertion (Exercise) 13    Perceived Dyspnea (Exercise) 1    Duration Continue with 30 min of aerobic exercise without signs/symptoms of physical distress.    Intensity THRR unchanged      Progression    Progression Continue to progress workloads to maintain intensity without signs/symptoms of physical distress.      Resistance Training   Training Prescription Yes    Weight black bands    Reps 10-15    Time 10 Minutes      Bike   Level 3    Watts 60    Minutes 15    METs 3      Track   Laps  12    Minutes 15    METs 3.31             Nutrition:  Target Goals: Understanding of nutrition guidelines, daily intake of sodium 1500mg , cholesterol 200mg , calories 30% from fat and 7% or less from saturated fats, daily to have 5 or more servings of fruits and vegetables.  Biometrics:  Pre Biometrics - 06/18/23 1101       Pre Biometrics   Grip Strength 38 kg              Nutrition Therapy Plan and Nutrition Goals:  Nutrition Therapy & Goals - 06/26/23 1134       Nutrition Therapy   Diet Heart Healthy diet    Drug/Food Interactions Statins/Certain Fruits      Personal Nutrition Goals   Nutrition Goal Patient to improve diet quality by using the plate method as a guide for meal planning to include lean protein/plant protein, fruits, vegetables, whole grains, nonfat dairy as part of a well-balanced diet.    Comments Ernest Mckay has medical history of COPD1, HTN, hyperlipidemia. Lipids WNL. Patient will benefit from participation inpulmonary rehab for nutrition, exercise, and lifestyle modification support.      Intervention Plan   Intervention Prescribe, educate and counsel regarding individualized specific dietary modifications aiming towards targeted core components such as weight, hypertension, lipid management, diabetes, heart failure and other comorbidities.;Nutrition handout(s) given to patient.    Expected Outcomes Short Term Goal: Understand basic principles of dietary content, such as calories, fat, sodium, cholesterol and nutrients.;Long Term Goal: Adherence to prescribed nutrition plan.             Nutrition Assessments:  Nutrition Assessments - 06/26/23 0919        Rate Your Plate Scores   Pre Score 41            MEDIFICTS Score Key: >=70 Need to make dietary changes  40-70 Heart Healthy Diet <= 40 Therapeutic Level Cholesterol Diet  Flowsheet Row PULMONARY REHAB CHRONIC OBSTRUCTIVE PULMONARY DISEASE from 06/26/2023 in [facility]  Picture Your Plate Total Score on Admission 41      Picture Your Plate Scores: <91 Unhealthy dietary pattern with much room for improvement. 41-50 Dietary pattern unlikely to meet recommendations for good health and room for improvement. 51-60 More healthful dietary pattern, with some room for improvement.  >60 Healthy dietary pattern, although there may be some specific behaviors that could be improved.    Nutrition Goals Re-Evaluation:  Nutrition Goals Re-Evaluation     Row Name 06/26/23 1134             Goals   Current Weight 218 lb 0.6 oz (98.9 kg)       Comment Most recent labs from 2021-lipids WNL, LDL 52, A1c WNL. [facility] for review in care everywhere       Expected Outcome Ernest Mckay has medical history of COPD1, HTN, hyperlipidemia. Lipids WNL. Patient will benefit from participation inpulmonary rehab for nutrition, exercise, and lifestyle modification support.                Nutrition Goals Discharge (Final Nutrition Goals Re-Evaluation):  Nutrition Goals Re-Evaluation - 06/26/23 1134       Goals   Current Weight 218 lb 0.6 oz (98.9 kg)    Comment Most recent labs from 2021-lipids WNL, LDL 52, A1c WNL. [facility] for review in care everywhere  Expected Outcome Ernest Mckay has medical history of COPD1, HTN, hyperlipidemia. Lipids WNL. Patient will benefit from participation inpulmonary rehab for nutrition, exercise, and lifestyle modification support.             Psychosocial: Target Goals: Acknowledge presence or absence of significant depression and/or stress, maximize coping skills, provide  positive support system. Participant is able to verbalize types and ability to use techniques and skills needed for reducing stress and depression.  Initial Review & Psychosocial Screening:  Initial Psych Review & Screening - 06/18/23 1041       Initial Review   Current issues with None Identified      Family Dynamics   Good Support System? Yes    Comments wife, Lenon Radar      Barriers   Psychosocial barriers to participate in program The patient should benefit from training in stress management and relaxation.      Screening Interventions   Interventions Encouraged to exercise    Expected Outcomes Short Term goal: Utilizing psychosocial counselor, staff and physician to assist with identification of specific Stressors or current issues interfering with healing process. Setting desired goal for each stressor or current issue identified.;Long Term Goal: Stressors or current issues are controlled or eliminated.;Short Term goal: Identification and review with participant of any Quality of Life or Depression concerns found by scoring the questionnaire.;Long Term goal: The participant improves quality of Life and PHQ9 Scores as seen by post scores and/or verbalization of changes             Quality of Life Scores:  Scores of 19 and below usually indicate a poorer quality of life in these areas.  A difference of  2-3 points is a clinically meaningful difference.  A difference of 2-3 points in the total score of the Quality of Life Index has been associated with significant improvement in overall quality of life, self-image, physical symptoms, and general health in studies assessing change in quality of life.  PHQ-9: Review Flowsheet  More data may exist      06/18/2023 10/08/2019 10/09/2018 10/10/2017 10/19/2016  Depression screen PHQ 2/9  Decreased Interest 0 1 0 0 0 0  Down, Depressed, Hopeless 1 0 0 0 0 0  PHQ - 2 Score 1 1 0 0 0 0  Altered sleeping 1 - - - -  Tired, decreased energy 2 -  - - -  Change in appetite 0 - - - -  Feeling bad or failure about yourself  0 - - - -  Trouble concentrating 1 - - - -  Moving slowly or fidgety/restless 1 - - - -  Suicidal thoughts 0 - - - -  PHQ-9 Score 6 - - - -  Difficult doing work/chores Somewhat difficult - - - -    Details       Multiple values from one day are sorted in reverse-chronological order        Interpretation of Total Score  Total Score Depression Severity:  1-4 = Minimal depression, 5-9 = Mild depression, 10-14 = Moderate depression, 15-19 = Moderately severe depression, 20-27 = Severe depression   Psychosocial Evaluation and Intervention:  Psychosocial Evaluation - 06/18/23 1044       Psychosocial Evaluation & Interventions   Interventions Encouraged to exercise with the program and follow exercise prescription    Comments only stress is when he doesn't get his to-do list done    Expected Outcomes For pt to participate in PR    Continue Psychosocial Services  No Follow up required             Psychosocial Re-Evaluation:  Psychosocial Re-Evaluation     Row Name 07/04/23 1045             Psychosocial Re-Evaluation   Current issues with None Identified       Comments Psychosocial monthly re-evaluation is as follows: Ernest Mckay still denies any needs or barriers at this time. He enjoys coming to rehab. He has been getting his to-do lists completed.       Expected Outcomes For Ernest Mckay to participate in PR free of any psy/soc concerns or barriers.       Interventions Encouraged to attend Pulmonary Rehabilitation for the exercise       Continue Psychosocial Services  No Follow up required                Psychosocial Discharge (Final Psychosocial Re-Evaluation):  Psychosocial Re-Evaluation - 07/04/23 1045       Psychosocial Re-Evaluation   Current issues with None Identified    Comments Psychosocial monthly re-evaluation is as follows: Ernest Mckay still denies any needs or barriers at this time. He  enjoys coming to rehab. He has been getting his to-do lists completed.    Expected Outcomes For Ernest Mckay to participate in PR free of any psy/soc concerns or barriers.    Interventions Encouraged to attend Pulmonary Rehabilitation for the exercise    Continue Psychosocial Services  No Follow up required             Education: Education Goals: Education classes will be provided on a weekly basis, covering required topics. Participant will state understanding/return demonstration of topics presented.  Learning Barriers/Preferences:  Learning Barriers/Preferences - 06/18/23 1045       Learning Barriers/Preferences   Learning Barriers None    Learning Preferences Skilled Demonstration             Education Topics: Know Your Numbers Group instruction that is supported by a PowerPoint presentation. Instructor discusses importance of knowing and understanding resting, exercise, and post-exercise oxygen saturation, heart rate, and blood pressure. Oxygen saturation, heart rate, blood pressure, rating of perceived exertion, and dyspnea are reviewed along with a normal range for these values.  Flowsheet Row PULMONARY REHAB CHRONIC OBSTRUCTIVE PULMONARY DISEASE from 06/28/2023 in [facility] for Heart, Vascular, & Lung Health  Date 06/28/23  Educator EP  Instruction Review Code 1- Verbalizes Understanding       Exercise for the Pulmonary Patient Group instruction that is supported by a PowerPoint presentation. Instructor discusses benefits of exercise, core components of exercise, frequency, duration, and intensity of an exercise routine, importance of utilizing pulse oximetry during exercise, safety while exercising, and options of places to exercise outside of rehab.    MET Level  Group instruction provided by PowerPoint, verbal discussion, and written material to support subject matter. Instructor reviews what METs are and how to increase METs.    Pulmonary  Medications Verbally interactive group education provided by instructor with focus on inhaled medications and proper administration.   Anatomy and Physiology of the Respiratory System Group instruction provided by PowerPoint, verbal discussion, and written material to support subject matter. Instructor reviews respiratory cycle and anatomical components of the respiratory system and their functions. Instructor also reviews differences in obstructive and restrictive respiratory diseases with examples of each.    Oxygen Safety Group instruction provided by PowerPoint, verbal discussion, and written material to support subject matter. There is an overview of "  What is Oxygen" and "Why do we need it".  Instructor also reviews how to create a safe environment for oxygen use, the importance of using oxygen as prescribed, and the risks of noncompliance. There is a brief discussion on traveling with oxygen and resources the patient may utilize. Flowsheet Row PULMONARY REHAB CHRONIC OBSTRUCTIVE PULMONARY DISEASE from 07/05/2023 in [facility] for Heart, Vascular, & Lung Health  Date 07/05/23  Educator EP  Instruction Review Code 1- Verbalizes Understanding       Oxygen Use Group instruction provided by PowerPoint, verbal discussion, and written material to discuss how supplemental oxygen is prescribed and different types of oxygen supply systems. Resources for more information are provided.    Breathing Techniques Group instruction that is supported by demonstration and informational handouts. Instructor discusses the benefits of pursed lip and diaphragmatic breathing and detailed demonstration on how to perform both.     Risk Factor Reduction Group instruction that is supported by a PowerPoint presentation. Instructor discusses the definition of a risk factor, different risk factors for pulmonary disease, and how the heart and lungs work together.   Pulmonary  Diseases Group instruction provided by PowerPoint, verbal discussion, and written material to support subject matter. Instructor gives an overview of the different type of pulmonary diseases. There is also a discussion on risk factors and symptoms as well as ways to manage the diseases.   Stress and Energy Conservation Group instruction provided by PowerPoint, verbal discussion, and written material to support subject matter. Instructor gives an overview of stress and the impact it can have on the body. Instructor also reviews ways to reduce stress. There is also a discussion on energy conservation and ways to conserve energy throughout the day.   Warning Signs and Symptoms Group instruction provided by PowerPoint, verbal discussion, and written material to support subject matter. Instructor reviews warning signs and symptoms of stroke, heart attack, cold and flu. Instructor also reviews ways to prevent the spread of infection.   Other Education Group or individual verbal, written, or video instructions that support the educational goals of the pulmonary rehab program.    Knowledge Questionnaire Score:  Knowledge Questionnaire Score - 06/18/23 1142       Knowledge Questionnaire Score   Pre Score 14/18             Core Components/Risk Factors/Patient Goals at Admission:  Personal Goals and Risk Factors at Admission - 06/18/23 1045       Core Components/Risk Factors/Patient Goals on Admission   Improve shortness of breath with ADL's Yes    Intervention Provide education, individualized exercise plan and daily activity instruction to help decrease symptoms of SOB with activities of daily living.    Expected Outcomes Short Term: Improve cardiorespiratory fitness to achieve a reduction of symptoms when performing ADLs;Long Term: Be able to perform more ADLs without symptoms or delay the onset of symptoms             Core Components/Risk Factors/Patient Goals Review:   Goals  and Risk Factor Review     Row Name 07/04/23 1049             Core Components/Risk Factors/Patient Goals Review   Personal Goals Review Improve shortness of breath with ADL's;Develop more efficient breathing techniques such as purse lipped breathing and diaphragmatic breathing and practicing self-pacing with activity.       Review Monthly review of patient's Core Components/Risk Factors/Patient Goals are as follows: Goal in progress for improving  his shortness of breath with ADLs and developing more efficient breathing techniques such as purse lipped breathing and diaphragmatic breathing; and practicing self-pacing with activity. Ernest Mckay has completed 3 classes and has been slowly increasing his workload and METs. His oxygen needs have been stable on room air. He is trying hard to build up his endurance and stamina to complete activities of daily living at home with decreased shortness of breath. Ernest Mckay will continue to benefit from PR for nutrition, education, exercise, and lifestyle modification.       Expected Outcomes For Ernest Mckay to develop more efficient breathing techniques such as purse lipped breathing and diaphragmatic breathing; and practice self-pacing with activity, and improve his shortness of breath with ADLs.                Core Components/Risk Factors/Patient Goals at Discharge (Final Review):   Goals and Risk Factor Review - 07/04/23 1049       Core Components/Risk Factors/Patient Goals Review   Personal Goals Review Improve shortness of breath with ADL's;Develop more efficient breathing techniques such as purse lipped breathing and diaphragmatic breathing and practicing self-pacing with activity.    Review Monthly review of patient's Core Components/Risk Factors/Patient Goals are as follows: Goal in progress for improving his shortness of breath with ADLs and developing more efficient breathing techniques such as purse lipped breathing and diaphragmatic breathing; and  practicing self-pacing with activity. Ernest Mckay has completed 3 classes and has been slowly increasing his workload and METs. His oxygen needs have been stable on room air. He is trying hard to build up his endurance and stamina to complete activities of daily living at home with decreased shortness of breath. Ernest Mckay will continue to benefit from PR for nutrition, education, exercise, and lifestyle modification.    Expected Outcomes For Ernest Mckay to develop more efficient breathing techniques such as purse lipped breathing and diaphragmatic breathing; and practice self-pacing with activity, and improve his shortness of breath with ADLs.             ITP Comments:Pt is making expected progress toward Pulmonary Rehab goals after completing 5 session(s). Recommend continued exercise, life style modification, education, and utilization of breathing techniques to increase stamina and strength, while also decreasing shortness of breath with exertion.  Dr. Genetta Kenning is Medical Director for Pulmonary Rehab at [facility].     Comments:

## 2023-07-12 ENCOUNTER — Encounter (HOSPITAL_COMMUNITY)
Admission: RE | Admit: 2023-07-12 | Discharge: 2023-07-12 | Disposition: A | Discharge status: Home / Self Care | Source: Ambulatory Visit | Attending: Pulmonary Disease | Admitting: Pulmonary Disease

## 2023-07-12 DIAGNOSIS — J449 Chronic obstructive pulmonary disease, unspecified: Secondary | ICD-10-CM | POA: Diagnosis not present

## 2023-07-12 NOTE — Progress Notes (Signed)
 Daily Session Note  Patient Details  Name: Ernest Mckay MRN: [id number] Date of Birth: [birth year] Referring Provider:   [name] Pulmonary Rehab Walk Test from 06/18/2023 in [facility] for Heart, Vascular, & Lung Health  Referring Provider [name]       Encounter Date: 07/12/2023  Check In:  Session Check In - 07/12/23 7829       Check-In   Supervising physician immediately available to respond to emergencies [facility] MD immediately available    Physician(s) Rejeana Card, NP    Location [facility]    Staff Present Sueellen Emery BS, ACSM-CEP, Exercise Physiologist;Kaylee Nolon Baxter, MS, ACSM-CEP, Exercise Physiologist;Ijeoma Loor Carmen Chol, RN, BSN    Virtual Visit No    Medication changes reported     No    Fall or balance concerns reported    No    Tobacco Cessation No Change    Warm-up and Cool-down Performed as group-led instruction    Resistance Training Performed Yes    VAD Patient? No    PAD/SET Patient? No      Pain Assessment   Currently in Pain? No/denies    Multiple Pain Sites No             Capillary Blood Glucose: No results found for this or any previous visit (from the past 24 hours).    Social History   Tobacco Use  Smoking Status Former   Types: Cigars   Quit date: 10/11/1998   Years since quitting: 24.7  Smokeless Tobacco Current   Types: Snuff  Tobacco Comments   50 years of snuff use    Goals Met:  Proper associated with RPD/PD & O2 Sat Independence with exercise equipment Exercise tolerated well No report of concerns or symptoms today Strength training completed today  Goals Unmet:  Not Applicable  Comments: Service time is from 0806 to 0930.    Dr. Genetta Kenning is Medical Director for Pulmonary Rehab at [facility].

## 2023-07-17 ENCOUNTER — Encounter (HOSPITAL_COMMUNITY)
Admission: RE | Admit: 2023-07-17 | Discharge: 2023-07-17 | Disposition: A | Discharge status: Home / Self Care | Source: Ambulatory Visit | Attending: Pulmonary Disease

## 2023-07-17 DIAGNOSIS — J449 Chronic obstructive pulmonary disease, unspecified: Secondary | ICD-10-CM

## 2023-07-17 NOTE — Progress Notes (Signed)
 Daily Session Note  Patient Details  Name: Ernest Mckay MRN: [id number] Date of Birth: [birth year] Referring Provider:   [name] Pulmonary Rehab Walk Test from 06/18/2023 in [facility] for Heart, Vascular, & Lung Health  Referring Provider [name]       Encounter Date: 07/17/2023  Check In:  Session Check In - 07/17/23 0825       Check-In   Supervising physician immediately available to respond to emergencies [facility] MD immediately available    Physician(s) Marlana Silvan, NP    [facility]    Staff Present Sueellen Emery BS, ACSM-CEP, Exercise Physiologist;Kaylee Nolon Baxter, MS, ACSM-CEP, Exercise Physiologist;Cheynne Virden Felipe Horton, RT    Virtual Visit No    Medication changes reported     No    Fall or balance concerns reported    No    Tobacco Cessation No Change    Warm-up and Cool-down Performed as group-led instruction    Resistance Training Performed Yes    VAD Patient? No    PAD/SET Patient? No      Pain Assessment   Currently in Pain? No/denies    Multiple Pain Sites No             Capillary Blood Glucose: No results found for this or any previous visit (from the past 24 hours).    Social History   Tobacco Use  Smoking Status Former   Types: Cigars   Quit date: 10/11/1998   Years since quitting: 24.7  Smokeless Tobacco Current   Types: Snuff  Tobacco Comments   50 years of snuff use    Goals Met:  Proper associated with RPD/PD & O2 Sat Independence with exercise equipment Exercise tolerated well No report of concerns or symptoms today Strength training completed today  Goals Unmet:  Not Applicable  Comments: Service time is from 0812 to 0929.    Dr. Genetta Kenning is Medical Director for Pulmonary Rehab at [facility].

## 2023-07-19 ENCOUNTER — Encounter (HOSPITAL_COMMUNITY)
Admission: RE | Admit: 2023-07-19 | Discharge: 2023-07-19 | Disposition: A | Discharge status: Home / Self Care | Source: Ambulatory Visit | Attending: Pulmonary Disease

## 2023-07-19 DIAGNOSIS — J449 Chronic obstructive pulmonary disease, unspecified: Secondary | ICD-10-CM

## 2023-07-19 NOTE — Progress Notes (Signed)
 Daily Session Note  Patient Details  Name: Ernest Mckay MRN: [id number] Date of Birth: [birth year] Referring Provider:   [name] Pulmonary Rehab Walk Test from 06/18/2023 in [facility] for Heart, Vascular, & Lung Health  Referring Provider [name]       Encounter Date: 07/19/2023  Check In:  Session Check In - 07/19/23 7253       Check-In   Supervising physician immediately available to respond to emergencies [facility] immediately available    Physician(s) Lawana Pray, NP    [facility]    Staff Present Sueellen Emery BS, ACSM-CEP, Exercise Physiologist;Kaylee Nolon Baxter, MS, ACSM-CEP, Exercise Physiologist;Laylani Pudwill Carmen Chol, RN, BSN    Virtual Visit No    Medication changes reported     No    Fall or balance concerns reported    No    Tobacco Cessation No Change    Warm-up and Cool-down Performed as group-led instruction    Resistance Training Performed Yes    VAD Patient? No    PAD/SET Patient? No      Pain Assessment   Currently in Pain? No/denies    Multiple Pain Sites No             Capillary Blood Glucose: No results found for this or any previous visit (from the past 24 hours).    Social History   Tobacco Use  Smoking Status Former   Types: Cigars   Quit date: 10/11/1998   Years since quitting: 24.7  Smokeless Tobacco Current   Types: Snuff  Tobacco Comments   50 years of snuff use    Goals Met:  Proper associated with RPD/PD & O2 Sat Independence with exercise equipment Exercise tolerated well No report of concerns or symptoms today Strength training completed today  Goals Unmet:  Not Applicable  Comments: Service time is from 0811 to 0929.    Dr. Genetta Kenning is Medical Director for Pulmonary Rehab at [facility].

## 2023-07-24 ENCOUNTER — Encounter (HOSPITAL_COMMUNITY)
Admission: RE | Admit: 2023-07-24 | Discharge: 2023-07-24 | Disposition: A | Discharge status: Home / Self Care | Source: Ambulatory Visit | Attending: Pulmonary Disease

## 2023-07-24 DIAGNOSIS — J449 Chronic obstructive pulmonary disease, unspecified: Secondary | ICD-10-CM

## 2023-07-24 NOTE — Progress Notes (Deleted)
 Daily Session Note  Patient Details  Name: Ernest Mckay MRN: [id number] Date of Birth: 03-05-1946 Referring Provider:   [name] Pulmonary Rehab Walk Test from 06/18/2023 in [facility] for Heart, Vascular, & Lung Health  Referring Provider [name]       Encounter Date: 07/24/2023  Check In:  Session Check In - 07/24/23 2130       Check-In   Supervising physician immediately available to respond to emergencies [facility] immediately available    Physician(s) Lawana Pray, NP    [facility]    Staff Present Sueellen Emery BS, ACSM-CEP, Exercise Physiologist;Kaylee Nolon Baxter, MS, ACSM-CEP, Exercise Physiologist;Casey Carmen Chol, RN, BSN    Virtual Visit No    Medication changes reported     No    Fall or balance concerns reported    No    Tobacco Cessation No Change    Warm-up and Cool-down Performed as group-led instruction    Resistance Training Performed Yes    VAD Patient? No    PAD/SET Patient? No      Pain Assessment   Currently in Pain? No/denies    Multiple Pain Sites No             Capillary Blood Glucose: No results found for this or any previous visit (from the past 24 hours).    Social History   Tobacco Use  Smoking Status Former   Types: Cigars   Quit date: 10/11/1998   Years since quitting: 24.8  Smokeless Tobacco Current   Types: Snuff  Tobacco Comments   50 years of snuff use    Goals Met:  Independence with exercise equipment Exercise tolerated well No report of concerns or symptoms today Strength training completed today  Goals Unmet:  Not Applicable  Comments: Service time is from 0822 to [id number].    Dr. Genetta Kenning is Medical Director for Pulmonary Rehab at [facility].

## 2023-07-24 NOTE — Progress Notes (Signed)
 Daily Session Note  Patient Details  Name: Ernest Mckay MRN: [id number] Date of Birth: Feb 07, 1946 Referring Provider:   [name] Pulmonary Rehab Walk Test from 06/18/2023 in [facility] for Heart, Vascular, & Lung Health  Referring Provider [name]       Encounter Date: 07/24/2023  Check In:  Session Check In - 07/24/23 0865       Check-In   Supervising physician immediately available to respond to emergencies [facility] MD immediately available    Physician(s) Lawana Pray, NP    [facility]    Staff Present Sueellen Emery BS, ACSM-CEP, Exercise Physiologist;Franny Selvage Nolon Baxter, MS, ACSM-CEP, Exercise Physiologist;Casey Carmen Chol, RN, BSN    Virtual Visit No    Medication changes reported     No    Fall or balance concerns reported    No    Tobacco Cessation No Change    Warm-up and Cool-down Performed as group-led instruction    Resistance Training Performed Yes    VAD Patient? No    PAD/SET Patient? No      Pain Assessment   Currently in Pain? No/denies    Multiple Pain Sites No             Capillary Blood Glucose: No results found for this or any previous visit (from the past 24 hours).   Exercise Prescription Changes - 07/24/23 0957       Response to Exercise   Blood Pressure (Admit) 122/68    Blood Pressure (Exercise) 142/66    Blood Pressure (Exit) 112/70    Heart Rate (Admit) 85 bpm    Heart Rate (Exercise) 110 bpm    Heart Rate (Exit) 87 bpm    Oxygen Saturation (Admit) 99 %    Oxygen Saturation (Exercise) 97 %    Oxygen Saturation (Exit) 97 %    Rating of Perceived Exertion (Exercise) 12    Perceived Dyspnea (Exercise) 3    Duration Continue with 30 min of aerobic exercise without signs/symptoms of physical distress.    Intensity THRR unchanged      Progression   Progression Continue to progress workloads to maintain intensity without signs/symptoms of physical distress.      Resistance  Training   Training Prescription Yes    Weight black bands    Reps 10-15    Time 10 Minutes      Treadmill   MPH 1.8    Grade 0    Minutes 15    METs 2.2      Bike   Level 4    Watts 54    Minutes 69    METs 3.2             Social History   Tobacco Use  Smoking Status Former   Types: Cigars   Quit date: 10/11/1998   Years since quitting: 24.8  Smokeless Tobacco Current   Types: Snuff  Tobacco Comments   50 years of snuff use    Goals Met:  Proper associated with RPD/PD & O2 Sat Exercise tolerated well No report of concerns or symptoms today Strength training completed today  Goals Unmet:  Not Applicable  Comments: Service time is from 0822 to [id number].    Dr. Genetta Kenning is Medical Director for Pulmonary Rehab at [facility].

## 2023-07-26 ENCOUNTER — Encounter (HOSPITAL_COMMUNITY)
Admission: RE | Admit: 2023-07-26 | Discharge: 2023-07-26 | Disposition: A | Discharge status: Home / Self Care | Source: Ambulatory Visit | Attending: Pulmonary Disease | Admitting: Pulmonary Disease

## 2023-07-26 DIAGNOSIS — J449 Chronic obstructive pulmonary disease, unspecified: Secondary | ICD-10-CM | POA: Diagnosis present

## 2023-07-26 NOTE — Progress Notes (Signed)
 Daily Session Note  Patient Details  Name: Ernest Mckay MRN: [id number] Date of Birth: 1945/12/14 Referring Provider:   [name] Pulmonary Rehab Walk Test from 06/18/2023 in [facility] for Heart, Vascular, & Lung Health  Referring Provider [name]       Encounter Date: 07/26/2023  Check In:  Session Check In - 07/26/23 7829       Check-In   Supervising physician immediately available to respond to emergencies [facility] MD immediately available    Physician(s) Koren Persons, NP    [facility]    Staff Present Sueellen Emery BS, ACSM-CEP, Exercise Physiologist;Ryann Leavitt Nolon Baxter, MS, ACSM-CEP, Exercise Physiologist;Casey Carmen Chol, RN, BSN    Virtual Visit No    Medication changes reported     No    Fall or balance concerns reported    No    Tobacco Cessation No Change    Warm-up and Cool-down Performed as group-led instruction    Resistance Training Performed Yes    VAD Patient? No    PAD/SET Patient? No      Pain Assessment   Currently in Pain? No/denies    Multiple Pain Sites No             Capillary Blood Glucose: No results found for this or any previous visit (from the past 24 hours).    Social History   Tobacco Use  Smoking Status Former   Types: Cigars   Quit date: 10/11/1998   Years since quitting: 24.8  Smokeless Tobacco Current   Types: Snuff  Tobacco Comments   50 years of snuff use    Goals Met:  Proper associated with RPD/PD & O2 Sat Exercise tolerated well No report of concerns or symptoms today Strength training completed today  Goals Unmet:  Not Applicable  Comments: Service time is from 0807 to 0936.    Dr. Genetta Kenning is Medical Director for Pulmonary Rehab at [facility].

## 2023-07-31 ENCOUNTER — Encounter (HOSPITAL_COMMUNITY)
Admission: RE | Admit: 2023-07-31 | Discharge: 2023-07-31 | Disposition: A | Discharge status: Home / Self Care | Source: Ambulatory Visit | Attending: Pulmonary Disease

## 2023-07-31 DIAGNOSIS — J449 Chronic obstructive pulmonary disease, unspecified: Secondary | ICD-10-CM

## 2023-07-31 NOTE — Progress Notes (Signed)
 Daily Session Note  Patient Details  Name: Ernest Mckay MRN: [id number] Date of Birth: 1945-05-14 Referring Provider:   [name] Pulmonary Rehab Walk Test from 06/18/2023 in [facility] for Heart, Vascular, & Lung Health  Referring Provider [name]       Encounter Date: 07/31/2023  Check In:  Session Check In - 07/31/23 4098       Check-In   Supervising physician immediately available to respond to emergencies [facility] MD immediately available    Physician(s) Theresia Flasher, NP    [facility]    Staff Present Sueellen Emery BS, ACSM-CEP, Exercise Physiologist;Kaylee Nolon Baxter, MS, ACSM-CEP, Exercise Physiologist;Arelyn Gauer Carmen Chol, RN, BSN    Virtual Visit No    Medication changes reported     No    Fall or balance concerns reported    No    Tobacco Cessation No Change    Warm-up and Cool-down Performed as group-led instruction    Resistance Training Performed Yes    VAD Patient? No    PAD/SET Patient? No      Pain Assessment   Currently in Pain? No/denies    Multiple Pain Sites No             Capillary Blood Glucose: No results found for this or any previous visit (from the past 24 hours).    Social History   Tobacco Use  Smoking Status Former   Types: Cigars   Quit date: 10/11/1998   Years since quitting: 24.8  Smokeless Tobacco Current   Types: Snuff  Tobacco Comments   50 years of snuff use    Goals Met:  Proper associated with RPD/PD & O2 Sat Independence with exercise equipment Exercise tolerated well No report of concerns or symptoms today Strength training completed today  Goals Unmet:  Not Applicable  Comments: Service time is from 0802 to 0930.    Dr. Genetta Kenning is Medical Director for Pulmonary Rehab at [facility].

## 2023-08-02 ENCOUNTER — Encounter (HOSPITAL_COMMUNITY)
Admission: RE | Admit: 2023-08-02 | Discharge: 2023-08-02 | Disposition: A | Discharge status: Home / Self Care | Source: Ambulatory Visit | Attending: Pulmonary Disease | Admitting: Pulmonary Disease

## 2023-08-02 DIAGNOSIS — J449 Chronic obstructive pulmonary disease, unspecified: Secondary | ICD-10-CM | POA: Diagnosis not present

## 2023-08-02 NOTE — Progress Notes (Signed)
 Daily Session Note  Patient Details  Name: Ernest Mckay MRN: [id number] Date of Birth: [birth year] Referring Provider:   [name] Pulmonary Rehab Walk Test from 06/18/2023 in [facility]  Referring Provider [name]       Encounter Date: 08/02/2023  Check In:  Session Check In - 08/02/23 0818       Check-In   Supervising physician immediately available to respond to emergencies [facility] MD immediately available    Physician(s) Levin Reamer, NP    Location [facility]    Staff Present Sueellen Emery BS, ACSM-CEP, Exercise Physiologist;Kaylee Nolon Baxter, MS, ACSM-CEP, Exercise Physiologist;Arial Galligan Carmen Chol, RN, BSN    Virtual Visit No    Medication changes reported     No    Fall or balance concerns reported    No    Tobacco Cessation No Change    Warm-up and Cool-down Performed as group-led instruction    Resistance Training Performed Yes    VAD Patient? No    PAD/SET Patient? No      Pain Assessment   Currently in Pain? No/denies    Multiple Pain Sites No             Capillary Blood Glucose: No results found for this or any previous visit (from the past 24 hours).    Social History   Tobacco Use  Smoking Status Former   Types: Cigars   Quit date: 10/11/1998   Years since quitting: 24.8  Smokeless Tobacco Current   Types: Snuff  Tobacco Comments   50 years of snuff use    Goals Met:  Proper associated with RPD/PD & O2 Sat Independence with exercise equipment Exercise tolerated well No report of concerns or symptoms today Strength training completed today  Goals Unmet:  Not Applicable  Comments: Service time is from 0809 to 0924.    Dr. Genetta Kenning is Medical Director for Pulmonary Rehab at [facility].

## 2023-08-07 ENCOUNTER — Encounter (HOSPITAL_COMMUNITY)
Admission: RE | Admit: 2023-08-07 | Discharge: 2023-08-07 | Disposition: A | Discharge status: Home / Self Care | Source: Ambulatory Visit | Attending: Pulmonary Disease

## 2023-08-07 VITALS — Wt 212.7 lb

## 2023-08-07 DIAGNOSIS — J449 Chronic obstructive pulmonary disease, unspecified: Secondary | ICD-10-CM

## 2023-08-08 NOTE — Progress Notes (Signed)
 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 (Patient not taking: Reported on 06/18/2023), Disp: 30 tablet, Rfl: 3   olopatadine (PATANOL) 0.1 % ophthalmic solution, Place 1 drop into both eyes 2 (two) times daily., Disp: , Rfl:    omeprazole (PRILOSEC) 40 MG capsule, Take 40 mg by mouth in the morning and at bedtime. (Patient not taking: Reported on 06/18/2023), Disp: , Rfl:    rosuvastatin (CRESTOR) 5 MG tablet, Take 5 mg by mouth daily., Disp: , Rfl:    Tiotropium Bromide-Olodaterol 2.5-2.5 MCG/ACT AERS, Inhale 2.5 mcg into the lungs daily. 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   Steatosis of liver 06/15/2023    Tobacco Use: Social History   Tobacco Use  Smoking Status Former   Types: Cigars   Quit date: 10/11/1998   Years since quitting: 24.8  Smokeless Tobacco Current   Types: Snuff  Tobacco Comments   50 years of snuff use    Labs: Review Flowsheet  More data may exist      Latest Ref Rng & Units 10/02/2013 10/19/2016 10/10/2017 10/09/2018 10/08/2019  Labs for ITP Cardiac and Pulmonary Rehab  Cholestrol 100 - 199 mg/dL 782  956  213  086  578   LDL (calc) 0 - 99 mg/dL 469  629  89  528  52   HDL-C >39 mg/dL 80  71  88  62  413   Trlycerides 0 - 149 mg/dL 56  75  52  67  53   Hemoglobin A1c 4.0 - 5.6 % - - 5.4  5.7  5.3     Capillary Blood Glucose: No results found for: "GLUCAP"   Pulmonary Assessment Scores:  Pulmonary Assessment Scores     Row Name 06/18/23 1047         ADL UCSD   ADL Phase Entry     SOB Score total 37       CAT Score   CAT Score 19       mMRC Score   mMRC Score 1  UCSD: Self-administered rating of dyspnea associated with activities of daily living (ADLs) 6-point scale (0 = "not at all" to 5 = "maximal or unable to do because of breathlessness")  Scoring Scores range from 0 to 120.  Minimally important difference is 5 units  CAT: CAT can identify the health impairment of COPD patients and is better correlated with disease progression.  CAT has a scoring range of zero to 40. The CAT score is classified into four groups of low (less than 10), medium (10 - 20), high (21-30) and very high (31-40) based on the impact level of disease on health status. A CAT score over 10 suggests significant symptoms.  A worsening CAT score could be explained by an exacerbation, poor medication adherence, poor inhaler technique, or progression of COPD or comorbid conditions.  CAT MCID is 2 points  mMRC: mMRC (Modified Medical Research Council) Dyspnea Scale is used to assess the degree of  baseline functional disability in patients of respiratory disease due to dyspnea. No minimal important difference is established. A decrease in score of 1 point or greater is considered a positive change.   Pulmonary Function Assessment:   Exercise Target Goals: Exercise Program Goal: Individual exercise prescription set using results from initial 6 min walk test and THRR while considering  patient's activity barriers and safety.   Exercise Prescription Goal: Initial exercise prescription builds to 30-45 minutes a day of aerobic activity, 2-3 days per week.  Home exercise guidelines will be given to patient during program as part of exercise prescription that the participant will acknowledge.  Activity Barriers & Risk Stratification:  Activity Barriers & Cardiac Risk Stratification - 06/18/23 1052       Activity Barriers & Cardiac Risk Stratification   Activity Barriers Deconditioning;Muscular Weakness;Shortness of Breath;Back Problems;Arthritis    Cardiac Risk Stratification Moderate             6 Minute Walk:  6 Minute Walk     Row Name 06/18/23 1102 06/18/23 1130       6 Minute Walk   Phase -- Initial    Distance -- 1220 feet    Walk Time -- 6 minutes    # of Rest Breaks -- 0    MPH -- 2.31    METS -- 2.88    RPE -- 11    Perceived Dyspnea  -- 3    VO2 Peak -- 10.08    Symptoms -- No    Resting HR -- 90 bpm    Resting BP -- 140/72    Resting Oxygen Saturation  -- 97 %    Exercise Oxygen Saturation  during 6 min walk -- 97 %    Max Ex. HR -- 123 bpm    Max Ex. BP -- 176/70    2 Minute Post BP -- 142/70      Interval HR   1 Minute HR -- 110    2 Minute HR -- 118    3 Minute HR -- 119    4 Minute HR -- 118    5 Minute HR -- 120    6 Minute HR -- 123    2 Minute Post HR -- 95    Interval Heart Rate? -- Yes      Interval Oxygen   Interval Oxygen? -- Yes    Baseline Oxygen Saturation % -- 97 %    1 Minute Oxygen Saturation % -- 97 %    1 Minute Liters of  Oxygen -- 0  L    2 Minute Oxygen Saturation % -- 97 %    2 Minute Liters of Oxygen -- 0 L    3 Minute Oxygen Saturation % -- 97 %    3 Minute Liters of Oxygen -- 0 L    4 Minute Oxygen Saturation % -- 97 %    4 Minute Liters of Oxygen -- 0 L    5 Minute Oxygen Saturation % -- 97 %    5 Minute Liters of Oxygen -- 0 L    6 Minute Oxygen Saturation % -- 98 %    6 Minute Liters of Oxygen -- 0 L    2 Minute Post Oxygen Saturation % -- 98 %    2 Minute Post Liters of Oxygen -- 0 L             Oxygen Initial Assessment:  Oxygen Initial Assessment - 06/18/23 1046       Home Oxygen   Home Oxygen Device None    Sleep Oxygen Prescription None    Home Exercise Oxygen Prescription None    Compliance with Home Oxygen Use Yes      Initial 6 min Walk   Oxygen Used None      Program Oxygen Prescription   Program Oxygen Prescription None      Intervention   Short Term Goals To learn and understand importance of maintaining oxygen saturations>88%;To learn and demonstrate proper use of respiratory medications;To learn and understand importance of monitoring SPO2 with pulse oximeter and demonstrate accurate use of the pulse oximeter.;To learn and demonstrate proper pursed lip breathing techniques or other breathing techniques.     Long  Term Goals Verbalizes importance of monitoring SPO2 with pulse oximeter and return demonstration;Maintenance of O2 saturations>88%;Exhibits proper breathing techniques, such as pursed lip breathing or other method taught during program session;Demonstrates proper use of MDI's;Compliance with respiratory medication             Oxygen Re-Evaluation:  Oxygen Re-Evaluation     Row Name 07/02/23 0950 08/01/23 0922           Program Oxygen Prescription   Program Oxygen Prescription None None        Home Oxygen   Home Oxygen Device None None      Sleep Oxygen Prescription None None      Home Exercise Oxygen Prescription None None      Compliance with  Home Oxygen Use Yes Yes        Goals/Expected Outcomes   Short Term Goals To learn and understand importance of maintaining oxygen saturations>88%;To learn and demonstrate proper use of respiratory medications;To learn and understand importance of monitoring SPO2 with pulse oximeter and demonstrate accurate use of the pulse oximeter.;To learn and demonstrate proper pursed lip breathing techniques or other breathing techniques.  To learn and understand importance of maintaining oxygen saturations>88%;To learn and demonstrate proper use of respiratory medications;To learn and understand importance of monitoring SPO2 with pulse oximeter and demonstrate accurate use of the pulse oximeter.;To learn and demonstrate proper pursed lip breathing techniques or other breathing techniques.       Long  Term Goals Verbalizes importance of monitoring SPO2 with pulse oximeter and return demonstration;Maintenance of O2 saturations>88%;Exhibits proper breathing techniques, such as pursed lip breathing or other method taught during program session;Demonstrates proper use of MDI's;Compliance with respiratory medication Verbalizes importance of monitoring SPO2 with pulse oximeter and return demonstration;Maintenance of O2 saturations>88%;Exhibits proper breathing techniques, such as pursed lip breathing or  other method taught during program session;Demonstrates proper use of MDI's;Compliance with respiratory medication      Goals/Expected Outcomes Compliance and understanding of oxygen saturation monitoring and breathing techniques to decrease shortness of breath. Compliance and understanding of oxygen saturation monitoring and breathing techniques to decrease shortness of breath.               Oxygen Discharge (Final Oxygen Re-Evaluation):  Oxygen Re-Evaluation - 08/01/23 0922       Program Oxygen Prescription   Program Oxygen Prescription None      Home Oxygen   Home Oxygen Device None    Sleep Oxygen  Prescription None    Home Exercise Oxygen Prescription None    Compliance with Home Oxygen Use Yes      Goals/Expected Outcomes   Short Term Goals To learn and understand importance of maintaining oxygen saturations>88%;To learn and demonstrate proper use of respiratory medications;To learn and understand importance of monitoring SPO2 with pulse oximeter and demonstrate accurate use of the pulse oximeter.;To learn and demonstrate proper pursed lip breathing techniques or other breathing techniques.     Long  Term Goals Verbalizes importance of monitoring SPO2 with pulse oximeter and return demonstration;Maintenance of O2 saturations>88%;Exhibits proper breathing techniques, such as pursed lip breathing or other method taught during program session;Demonstrates proper use of MDI's;Compliance with respiratory medication    Goals/Expected Outcomes Compliance and understanding of oxygen saturation monitoring and breathing techniques to decrease shortness of breath.             Initial Exercise Prescription:  Initial Exercise Prescription - 06/18/23 1100       Date of Initial Exercise RX and Referring Provider   Date 06/18/23    Referring Provider [name]    Expected Discharge Date 09/13/23      Treadmill   MPH 2    Grade 0    Minutes 15    METs 2.5      Bike   Level 2    Minutes 15    METs 2.5      Prescription Details   Frequency (times per week) 2    Duration Progress to 30 minutes of continuous aerobic without signs/symptoms of physical distress      Intensity   THRR 40-80% of Max Heartrate 58-115    Ratings of Perceived Exertion 11-13    Perceived Dyspnea 0-4      Progression   Progression Continue to progress workloads to maintain intensity without signs/symptoms of physical distress.      Resistance Training   Training Prescription Yes    Weight black bands    Reps 10-15             Perform Capillary Blood Glucose checks as needed.  Exercise Prescription  Changes:   Exercise Prescription Changes     Row Name 06/26/23 0900 07/10/23 0900 07/24/23 0900 07/24/23 0957 08/07/23 0900     Response to Exercise   Blood Pressure (Admit) 130/68 132/70 122/68 122/68 112/64   Blood Pressure (Exercise) 154/80 148/70 142/66 142/66 140/70   Blood Pressure (Exit) 112/68 120/70 112/70 112/70 130/64   Heart Rate (Admit) 92 bpm 93 bpm 85 bpm 85 bpm 87 bpm   Heart Rate (Exercise) 124 bpm 114 bpm 110 bpm 110 bpm 115 bpm   Heart Rate (Exit) 100 bpm 100 bpm 87 bpm 87 bpm 93 bpm   Oxygen Saturation (Admit) 99 % 97 % 99 % 99 % 98 %   Oxygen Saturation (Exercise) 98 %  97 % 97 % 97 % 99 %   Oxygen Saturation (Exit) 97 % 97 % 97 % 97 % 97 %   Rating of Perceived Exertion (Exercise) 14 13 12 12 11    Perceived Dyspnea (Exercise) 2 1 3 3 3    Duration Progress to 30 minutes of  aerobic without signs/symptoms of physical distress Continue with 30 min of aerobic exercise without signs/symptoms of physical distress. Continue with 30 min of aerobic exercise without signs/symptoms of physical distress. Continue with 30 min of aerobic exercise without signs/symptoms of physical distress. Continue with 30 min of aerobic exercise without signs/symptoms of physical distress.   Intensity THRR unchanged THRR unchanged THRR unchanged THRR unchanged THRR unchanged     Progression   Progression Continue to progress workloads to maintain intensity without signs/symptoms of physical distress. Continue to progress workloads to maintain intensity without signs/symptoms of physical distress. Continue to progress workloads to maintain intensity without signs/symptoms of physical distress. Continue to progress workloads to maintain intensity without signs/symptoms of physical distress. Continue to progress workloads to maintain intensity without signs/symptoms of physical distress.     Resistance Training   Training Prescription Yes Yes Yes Yes Yes   Weight black bands black bands black bands  black bands black bands   Reps 10-15 10-15 10-15 10-15 10-15   Time 10 Minutes 10 Minutes 10 Minutes 10 Minutes 10 Minutes     Treadmill   MPH 1.6 -- 1.8 1.8 1.7   Grade 0 -- 0 0 2   Minutes 15 -- 15 15 15    METs 2.1 -- 2.2 2.2 2.6     Bike   Level 2 3 4 4 4    Watts -- 60 54 54 --   Minutes 15 15 69 69 15   METs 2.2 3 3.2 3.2 3.1     Track   Laps -- 12 -- -- --   Minutes -- 15 -- -- --   METs -- 3.31 -- -- --            Exercise Comments:   Exercise Comments     Row Name 06/26/23 1638           Exercise Comments Tylin completed his first day of exercise. He exercised for 15 min on the treadmill and upright bike. Pt averaged 2.1 METs at 1.6 mph on the treadmill and 2.2 METs at level 2 on the upright bike. He performed the warmup and cooldown standing with some struggle. Will have verbal cues next time. I am also considering transfering him to the track. Ryden looks somewhat unsteady on the treadmill.                Exercise Goals and Review:   Exercise Goals     Row Name 06/18/23 1053             Exercise Goals   Increase Physical Activity Yes       Intervention Provide advice, education, support and counseling about physical activity/exercise needs.;Develop an individualized exercise prescription for aerobic and resistive training based on initial evaluation findings, risk stratification, comorbidities and participant's personal goals.       Expected Outcomes Short Term: Attend rehab on a regular basis to increase amount of physical activity.;Long Term: Add in home exercise to make exercise part of routine and to increase amount of physical activity.;Long Term: Exercising regularly at least 3-5 days a week.       Increase Strength and Stamina Yes  Intervention Provide advice, education, support and counseling about physical activity/exercise needs.;Develop an individualized exercise prescription for aerobic and resistive training based on initial  evaluation findings, risk stratification, comorbidities and participant's personal goals.       Expected Outcomes Short Term: Increase workloads from initial exercise prescription for resistance, speed, and METs.;Short Term: Perform resistance training exercises routinely during rehab and add in resistance training at home;Long Term: Improve cardiorespiratory fitness, muscular endurance and strength as measured by increased METs and functional capacity ( )       Able to understand and use rate of perceived exertion (RPE) scale Yes       Intervention Provide education and explanation on how to use RPE scale       Expected Outcomes Short Term: Able to use RPE daily in rehab to express subjective intensity level;Long Term:  Able to use RPE to guide intensity level when exercising independently       Able to understand and use Dyspnea scale Yes       Intervention Provide education and explanation on how to use Dyspnea scale       Expected Outcomes Short Term: Able to use Dyspnea scale daily in rehab to express subjective sense of shortness of breath during exertion;Long Term: Able to use Dyspnea scale to guide intensity level when exercising independently       Knowledge and understanding of Target Heart Rate Range (THRR) Yes       Intervention Provide education and explanation of THRR including how the numbers were predicted and where they are located for reference       Expected Outcomes Short Term: Able to state/look up THRR;Long Term: Able to use THRR to govern intensity when exercising independently;Short Term: Able to use daily as guideline for intensity in rehab       Understanding of Exercise Prescription Yes       Intervention Provide education, explanation, and written materials on patient's individual exercise prescription       Expected Outcomes Short Term: Able to explain program exercise prescription;Long Term: Able to explain home exercise prescription to exercise independently                 Exercise Goals Re-Evaluation :  Exercise Goals Re-Evaluation     Row Name 07/02/23 0948 08/01/23 0919           Exercise Goal Re-Evaluation   Exercise Goals Review Increase Physical Activity;Able to understand and use Dyspnea scale;Understanding of Exercise Prescription;Increase Strength and Stamina;Knowledge and understanding of Target Heart Rate Range (THRR);Able to understand and use rate of perceived exertion (RPE) scale Increase Physical Activity;Able to understand and use Dyspnea scale;Understanding of Exercise Prescription;Increase Strength and Stamina;Knowledge and understanding of Target Heart Rate Range (THRR);Able to understand and use rate of perceived exertion (RPE) scale      Comments Laithan has completed 2 exercise sessions. He exercises for 15 on the track and upright bike. Kishaun averages 2.69 METs on the track and 2.8 METs at level 2 on the upright bike. He performs the warmup and cooldown standing with some struggle. Rodge was on the treadmill but had to be transferred to the track due to gait instability. Will continue to monitor and progress as able. Terrelle has completed 11 exercise sessions. He exercises for 15 on the treadmill and upright bike. Nicholad averages 2.5 METs at 1.8 mph and 1% incline on the treadmill and 3.6 METs at level 4 on the upright bike. He performs the warmup and cooldown standing  without limitations. Warmup and cooldown exercises have improved. Cloyce has also been progressed to the treadmill. He tolerates the treadmill fair and walks at a slow place. Will increase treadmill speed when he tolerates it better. Will continue to monitor and progress as able.      Expected Outcomes Through exercise at rehab and home, the patient will decrease shortness of breath with daily activities and feel confident in carrying out an exercise regimen at home. Through exercise at rehab and home, the patient will decrease shortness of breath with daily activities and  feel confident in carrying out an exercise regimen at home.               Discharge Exercise Prescription (Final Exercise Prescription Changes):  Exercise Prescription Changes - 08/07/23 0900       Response to Exercise   Blood Pressure (Admit) 112/64    Blood Pressure (Exercise) 140/70    Blood Pressure (Exit) 130/64    Heart Rate (Admit) 87 bpm    Heart Rate (Exercise) 115 bpm    Heart Rate (Exit) 93 bpm    Oxygen Saturation (Admit) 98 %    Oxygen Saturation (Exercise) 99 %    Oxygen Saturation (Exit) 97 %    Rating of Perceived Exertion (Exercise) 11    Perceived Dyspnea (Exercise) 3    Duration Continue with 30 min of aerobic exercise without signs/symptoms of physical distress.    Intensity THRR unchanged      Progression   Progression Continue to progress workloads to maintain intensity without signs/symptoms of physical distress.      Resistance Training   Training Prescription Yes    Weight black bands    Reps 10-15    Time 10 Minutes      Treadmill   MPH 1.7    Grade 2    Minutes 15    METs 2.6      Bike   Level 4    Minutes 15    METs 3.1             Nutrition:  Target Goals: Understanding of nutrition guidelines, daily intake of sodium 1500mg , cholesterol 200mg , calories 30% from fat and 7% or less from saturated fats, daily to have 5 or more servings of fruits and vegetables.  Biometrics:  Pre Biometrics - 06/18/23 1101       Pre Biometrics   Grip Strength 38 kg              Nutrition Therapy Plan and Nutrition Goals:  Nutrition Therapy & Goals - 07/26/23 1008       Nutrition Therapy   Diet Heart Healthy diet    Drug/Food Interactions Statins/Certain Fruits      Personal Nutrition Goals   Nutrition Goal Patient to improve diet quality by using the plate method as a guide for meal planning to include lean protein/plant protein, fruits, vegetables, whole grains, nonfat dairy as part of a well-balanced diet.   goal in progress.    Personal Goal #2 Patient to identify strategies for weight loss of 0.5-2.0# per week.   goal in progress.   Comments Goals in progress. Torry has medical history of COPD1, HTN, hyperlipidemia. Lipids WNL. He is motivated to lose weight with goal weight ~200#. We have discussed multiple strategies for weight loss including calorie density, the plate method as a guide for meal planning, benefits of high fiber/high protein intake, etc. He is down 2.4# since starting with our program. Patient will benefit  from participation inpulmonary rehab for nutrition, exercise, and lifestyle modification support.      Intervention Plan   Intervention Prescribe, educate and counsel regarding individualized specific dietary modifications aiming towards targeted core components such as weight, hypertension, lipid management, diabetes, heart failure and other comorbidities.;Nutrition handout(s) given to patient.    Expected Outcomes Short Term Goal: Understand basic principles of dietary content, such as calories, fat, sodium, cholesterol and nutrients.;Long Term Goal: Adherence to prescribed nutrition plan.             Nutrition Assessments:  Nutrition Assessments - 06/26/23 0919       Rate Your Plate Scores   Pre Score 41            MEDIFICTS Score Key: >=70 Need to make dietary changes  40-70 Heart Healthy Diet <= 40 Therapeutic Level Cholesterol Diet  Flowsheet Row PULMONARY REHAB CHRONIC OBSTRUCTIVE PULMONARY DISEASE from 06/26/2023 in [facility], [facility]  Picture Your Plate Total Score on Admission 41      Picture Your Plate Scores: <13 Unhealthy dietary pattern with much room for improvement. 41-50 Dietary pattern unlikely to meet recommendations for good health and room for improvement. 51-60 More healthful dietary pattern, with some room for improvement.  >60 Healthy dietary pattern, although there may be some specific behaviors that  could be improved.    Nutrition Goals Re-Evaluation:  Nutrition Goals Re-Evaluation     Row Name 06/26/23 1134 07/26/23 1008           Goals   Current Weight 218 lb 0.6 oz (98.9 kg) 215 lb 9.8 oz (97.8 kg)      Comment Most recent labs from 2021-lipids WNL, LDL 52, A1c WNL. [facility] not available for review in care everywhere A1c 6.1, lipids WNL      Expected Outcome Minor has medical history of COPD1, HTN, hyperlipidemia. Lipids WNL. Patient will benefit from participation inpulmonary rehab for nutrition, exercise, and lifestyle modification support. Goals in progress. Saivion has medical history of COPD1, HTN, hyperlipidemia. Lipids WNL. He is motivated to lose weight with goal weight ~200#. A1c is in a prediabetic range. We have discussed multiple strategies for weight loss including calorie density, the plate method as a guide for meal planning, benefits of high fiber/high protein intake, etc. He is down 2.4# since starting with our program. Patient will benefit from participation inpulmonary rehab for nutrition, exercise, and lifestyle modification support.               Nutrition Goals Discharge (Final Nutrition Goals Re-Evaluation):  Nutrition Goals Re-Evaluation - 07/26/23 1008       Goals   Current Weight 215 lb 9.8 oz (97.8 kg)    Comment A1c 6.1, lipids WNL    Expected Outcome Goals in progress. 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 Lipids WNL. He is motivated to lose weight with goal weight ~200#. A1c is in a prediabetic range. We have discussed multiple strategies for weight loss including calorie density, the plate method as a guide for meal planning, benefits of high fiber/high protein intake, etc. He is down 2.4# since starting with our program. Patient will benefit from participation inpulmonary rehab for nutrition, exercise, and lifestyle modification support.             Psychosocial: Target Goals: Acknowledge presence or absence of  significant depression and/or stress, maximize coping skills, provide positive support system. Participant is able to verbalize types and ability  to use techniques and skills needed for reducing stress and depression.  Initial Review & Psychosocial Screening:  Initial Psych Review & Screening - 06/18/23 1041       Initial Review   Current issues with None Identified      Family Dynamics   Good Support System? Yes    Comments wife, Lenon Radar      Barriers   Psychosocial barriers to participate in program The patient should benefit from training in stress management and relaxation.      Screening Interventions   Interventions Encouraged to exercise    Expected Outcomes Short Term goal: Utilizing psychosocial counselor, staff and physician to assist with identification of specific Stressors or current issues interfering with healing process. Setting desired goal for each stressor or current issue identified.;Long Term Goal: Stressors or current issues are controlled or eliminated.;Short Term goal: Identification and review with participant of any Quality of Life or Depression concerns found by scoring the questionnaire.;Long Term goal: The participant improves quality of Life and PHQ9 Scores as seen by post scores and/or verbalization of changes             Quality of Life Scores:  Scores of 19 and below usually indicate a poorer quality of life in these areas.  A difference of  2-3 points is a clinically meaningful difference.  A difference of 2-3 points in the total score of the Quality of Life Index has been associated with significant improvement in overall quality of life, self-image, physical symptoms, and general health in studies assessing change in quality of life.  PHQ-9: Review Flowsheet  More data may exist      06/18/2023 10/08/2019 10/09/2018 10/10/2017 10/19/2016  Depression screen PHQ 2/9  Decreased Interest 0 1 0 0 0 0  Down, Depressed, Hopeless 1 0 0 0 0 0  PHQ - 2 Score 1  1 0 0 0 0  Altered sleeping 1 - - - -  Tired, decreased energy 2 - - - -  Change in appetite 0 - - - -  Feeling bad or failure about yourself  0 - - - -  Trouble concentrating 1 - - - -  Moving slowly or fidgety/restless 1 - - - -  Suicidal thoughts 0 - - - -  PHQ-9 Score 6 - - - -  Difficult doing work/chores Somewhat difficult - - - -    Details       Multiple values from one day are sorted in reverse-chronological order        Interpretation of Total Score  Total Score Depression Severity:  1-4 = Minimal depression, 5-9 = Mild depression, 10-14 = Moderate depression, 15-19 = Moderately severe depression, 20-27 = Severe depression   Psychosocial Evaluation and Intervention:  Psychosocial Evaluation - 06/18/23 1044       Psychosocial Evaluation & Interventions   Interventions Encouraged to exercise with the program and follow exercise prescription    Comments only stress is when he doesn't get his to-do list done    Expected Outcomes For pt to participate in PR    Continue Psychosocial Services  No Follow up required             Psychosocial Re-Evaluation:  Psychosocial Re-Evaluation     Row Name 07/04/23 1045 07/30/23 1017           Psychosocial Re-Evaluation   Current issues with None Identified None Identified      Comments Psychosocial monthly re-evaluation is as follows: Sinclair Due  still denies any needs or barriers at this time. He enjoys coming to rehab. He has been getting his to-do lists completed. Cheyenne continues to deny any psychosocial barriers or concerns at this time.      Expected Outcomes For Karsin to participate in PR free of any psy/soc concerns or barriers. For Seng to participate in PR free of any psy/soc concerns or barriers.      Interventions Encouraged to attend Pulmonary Rehabilitation for the exercise Encouraged to attend Pulmonary Rehabilitation for the exercise      Continue Psychosocial Services  No Follow up required No Follow up  required               Psychosocial Discharge (Final Psychosocial Re-Evaluation):  Psychosocial Re-Evaluation - 07/30/23 1017       Psychosocial Re-Evaluation   Current issues with None Identified    Comments Jatniel continues to deny any psychosocial barriers or concerns at this time.    Expected Outcomes For Deiondre to participate in PR free of any psy/soc concerns or barriers.    Interventions Encouraged to attend Pulmonary Rehabilitation for the exercise    Continue Psychosocial Services  No Follow up required             Education: Education Goals: Education classes will be provided on a weekly basis, covering required topics. Participant will state understanding/return demonstration of topics presented.  Learning Barriers/Preferences:  Learning Barriers/Preferences - 06/18/23 1045       Learning Barriers/Preferences   Learning Barriers None    Learning Preferences Skilled Demonstration             Education Topics: Know Your Numbers Group instruction that is supported by a PowerPoint presentation. Instructor discusses importance of knowing and understanding resting, exercise, and post-exercise oxygen saturation, heart rate, and blood pressure. Oxygen saturation, heart rate, blood pressure, rating of perceived exertion, and dyspnea are reviewed along with a normal range for these values.  Flowsheet Row PULMONARY REHAB CHRONIC OBSTRUCTIVE PULMONARY DISEASE from 06/28/2023 in [facility] for [facility], Vascular, & Lung Health  Date 06/28/23  Educator EP  Instruction Review Code 1- Verbalizes Understanding       Exercise for the Pulmonary Patient Group instruction that is supported by a PowerPoint presentation. Instructor discusses benefits of exercise, core components of exercise, frequency, duration, and intensity of an exercise routine, importance of utilizing pulse oximetry during exercise, safety while exercising, and options of places to  exercise outside of rehab.    MET Level  Group instruction provided by PowerPoint, verbal discussion, and written material to support subject matter. Instructor reviews what METs are and how to increase METs.    Pulmonary Medications Verbally interactive group education provided by instructor with focus on inhaled medications and proper administration.   Anatomy and Physiology of the Respiratory System Group instruction provided by PowerPoint, verbal discussion, and written material to support subject matter. Instructor reviews respiratory cycle and anatomical components of the respiratory system and their functions. Instructor also reviews differences in obstructive and restrictive respiratory diseases with examples of each.    Oxygen Safety Group instruction provided by PowerPoint, verbal discussion, and written material to support subject matter. There is an overview of "What is Oxygen" and "Why do we need it".  Instructor also reviews how to create a safe environment for oxygen use, the importance of using oxygen as prescribed, and the risks of noncompliance. There is a brief discussion on traveling with oxygen and resources the patient may  utilize. Flowsheet Row PULMONARY REHAB CHRONIC OBSTRUCTIVE PULMONARY DISEASE from 07/05/2023 in [facility] for Heart, Vascular, & Lung Health  Date 07/05/23  Educator EP  Instruction Review Code 1- Verbalizes Understanding       Oxygen Use Group instruction provided by PowerPoint, verbal discussion, and written material to discuss how supplemental oxygen is prescribed and different types of oxygen supply systems. Resources for more information are provided.    Breathing Techniques Group instruction that is supported by demonstration and informational handouts. Instructor discusses the benefits of pursed lip and diaphragmatic breathing and detailed demonstration on how to perform both.  Flowsheet Row PULMONARY REHAB  CHRONIC OBSTRUCTIVE PULMONARY DISEASE from 07/19/2023 in [facility] for Heart, Vascular, & Lung Health  Date 07/19/23  Educator RN  Instruction Review Code 1- Verbalizes Understanding        Risk Factor Reduction Group instruction that is supported by a PowerPoint presentation. Instructor discusses the definition of a risk factor, different risk factors for pulmonary disease, and how the heart and lungs work together.   Pulmonary Diseases Group instruction provided by PowerPoint, verbal discussion, and written material to support subject matter. Instructor gives an overview of the different type of pulmonary diseases. There is also a discussion on risk factors and symptoms as well as ways to manage the diseases.   Stress and Energy Conservation Group instruction provided by PowerPoint, verbal discussion, and written material to support subject matter. Instructor gives an overview of stress and the impact it can have on the body. Instructor also reviews ways to reduce stress. There is also a discussion on energy conservation and ways to conserve energy throughout the day. Flowsheet Row PULMONARY REHAB CHRONIC OBSTRUCTIVE PULMONARY DISEASE from 07/26/2023 in [facility] for Heart, Vascular, & Lung Health  Date 07/26/23  Educator RN  Instruction Review Code 1- Verbalizes Understanding       Warning Signs and Symptoms Group instruction provided by PowerPoint, verbal discussion, and written material to support subject matter. Instructor reviews warning signs and symptoms of stroke, heart attack, cold and flu. Instructor also reviews ways to prevent the spread of infection. Flowsheet Row PULMONARY REHAB CHRONIC OBSTRUCTIVE PULMONARY DISEASE from 08/02/2023 in [facility] for Heart, Vascular, & Lung Health  Date 08/02/23  Educator RN  Instruction Review Code 1- Verbalizes Understanding       Other Education Group or  individual verbal, written, or video instructions that support the educational goals of the pulmonary rehab program.    Knowledge Questionnaire Score:  Knowledge Questionnaire Score - 06/18/23 1142       Knowledge Questionnaire Score   Pre Score 14/18             Core Components/Risk Factors/Patient Goals at Admission:  Personal Goals and Risk Factors at Admission - 06/18/23 1045       Core Components/Risk Factors/Patient Goals on Admission   Improve shortness of breath with ADL's Yes    Intervention Provide education, individualized exercise plan and daily activity instruction to help decrease symptoms of SOB with activities of daily living.    Expected Outcomes Short Term: Improve cardiorespiratory fitness to achieve a reduction of symptoms when performing ADLs;Long Term: Be able to perform more ADLs without symptoms or delay the onset of symptoms             Core Components/Risk Factors/Patient Goals Review:   Goals and Risk Factor Review     Row Name 07/04/23  1049 07/30/23 1047           Core Components/Risk Factors/Patient Goals Review   Personal Goals Review Improve shortness of breath with ADL's;Develop more efficient breathing techniques such as purse lipped breathing and diaphragmatic breathing and practicing self-pacing with activity. Improve shortness of breath with ADL's;Develop more efficient breathing techniques such as purse lipped breathing and diaphragmatic breathing and practicing self-pacing with activity.      Review Monthly review of patient's Core Components/Risk Factors/Patient Goals are as follows: Goal in progress for improving his shortness of breath with ADLs and developing more efficient breathing techniques such as purse lipped breathing and diaphragmatic breathing; and practicing self-pacing with activity. Hughie has completed 3 classes and has been slowly increasing his workload and METs. His oxygen needs have been stable on room air. He is  trying hard to build up his endurance and stamina to complete activities of daily living at home with decreased shortness of breath. Corris will continue to benefit from PR for nutrition, education, exercise, and lifestyle modification. Monthly review of patient's Core Components/Risk Factors/Patient Goals are as follows: Goal in progress for improving his shortness of breath with ADLs and developing more efficient breathing techniques such as purse lipped breathing and diaphragmatic breathing; and practicing self-pacing with activity. He has recently transitioned from walking the track to the treadmill. He is also exercising on the bike. His oxygen needs have been stable on room air. Akaash will continue to benefit from PR for nutrition, education, exercise, and lifestyle modification.      Expected Outcomes For Kourosh to develop more efficient breathing techniques such as purse lipped breathing and diaphragmatic breathing; and practice self-pacing with activity, and improve his shortness of breath with ADLs. For Ezreal to develop more efficient breathing techniques such as purse lipped breathing and diaphragmatic breathing; and practice self-pacing with activity, and improve his shortness of breath with ADLs.               Core Components/Risk Factors/Patient Goals at Discharge (Final Review):   Goals and Risk Factor Review - 07/30/23 1047       Core Components/Risk Factors/Patient Goals Review   Personal Goals Review Improve shortness of breath with ADL's;Develop more efficient breathing techniques such as purse lipped breathing and diaphragmatic breathing and practicing self-pacing with activity.    Review Monthly review of patient's Core Components/Risk Factors/Patient Goals are as follows: Goal in progress for improving his shortness of breath with ADLs and developing more efficient breathing techniques such as purse lipped breathing and diaphragmatic breathing; and practicing self-pacing with  activity. He has recently transitioned from walking the track to the treadmill. He is also exercising on the bike. His oxygen needs have been stable on room air. Diron will continue to benefit from PR for nutrition, education, exercise, and lifestyle modification.    Expected Outcomes For Christoher to develop more efficient breathing techniques such as purse lipped breathing and diaphragmatic breathing; and practice self-pacing with activity, and improve his shortness of breath with ADLs.             ITP Comments: Pt is making expected progress toward Pulmonary Rehab goals after completing 13 session(s). Recommend continued exercise, life style modification, education, and utilization of breathing techniques to increase stamina and strength, while also decreasing shortness of breath with exertion.  Dr. Genetta Kenning is Medical Director for Pulmonary Rehab at [facility].

## 2023-08-09 ENCOUNTER — Encounter (HOSPITAL_COMMUNITY)
Admission: RE | Admit: 2023-08-09 | Discharge: 2023-08-09 | Disposition: A | Discharge status: Home / Self Care | Source: Ambulatory Visit | Attending: Pulmonary Disease | Admitting: Pulmonary Disease

## 2023-08-09 DIAGNOSIS — J449 Chronic obstructive pulmonary disease, unspecified: Secondary | ICD-10-CM | POA: Diagnosis not present

## 2023-08-09 NOTE — Progress Notes (Signed)
 Daily Session Note  Patient Details  Name: Ernest Mckay MRN: [id number] Date of Birth: 02-08-1946 Referring Provider:   [name] Pulmonary Rehab Walk Test from 06/18/2023 in [facility] for Heart, Vascular, & Lung Health  Referring Provider [name]       Encounter Date: 08/09/2023  Check In:  Session Check In - 08/09/23 4098       Check-In   Supervising physician immediately available to respond to emergencies [facility] MD immediately available    Physician(s) Palmer Bobo, NP    Location [facility]    Staff Present Sueellen Emery BS, ACSM-CEP, Exercise Physiologist;Casey Carmen Chol, RN, BSN;Samantha Belarus, RD, LDN    Virtual Visit No    Medication changes reported     No    Fall or balance concerns reported    No    Tobacco Cessation No Change    Warm-up and Cool-down Performed as group-led instruction    Resistance Training Performed Yes    VAD Patient? No    PAD/SET Patient? No      Pain Assessment   Currently in Pain? No/denies    Multiple Pain Sites No             Capillary Blood Glucose: No results found for this or any previous visit (from the past 24 hours).    Social History   Tobacco Use  Smoking Status Former   Types: Cigars   Quit date: 10/11/1998   Years since quitting: 24.8  Smokeless Tobacco Current   Types: Snuff  Tobacco Comments   50 years of snuff use    Goals Met:  Independence with exercise equipment Exercise tolerated well No report of concerns or symptoms today Strength training completed today  Goals Unmet:  Not Applicable  Comments: Service time is from 0808 to 0922.    Dr. Genetta Kenning is Medical Director for Pulmonary Rehab at [facility].

## 2023-08-14 ENCOUNTER — Encounter (HOSPITAL_COMMUNITY)
Admission: RE | Admit: 2023-08-14 | Discharge: 2023-08-14 | Disposition: A | Discharge status: Home / Self Care | Source: Ambulatory Visit | Attending: Pulmonary Disease | Admitting: Pulmonary Disease

## 2023-08-14 DIAGNOSIS — J449 Chronic obstructive pulmonary disease, unspecified: Secondary | ICD-10-CM | POA: Diagnosis not present

## 2023-08-14 NOTE — Progress Notes (Signed)
 Daily Session Note  Patient Details  Name: Jerrett Baldinger MRN: [id number] Date of Birth: 03/02/1946 Referring Provider:   [name] Pulmonary Rehab Walk Test from 06/18/2023 in [facility] for Heart, Vascular, & Lung Health  Referring Provider [name]       Encounter Date: 08/14/2023  Check In:  Session Check In - 08/14/23 0816       Check-In   Supervising physician immediately available to respond to emergencies [facility] MD immediately available    Physician(s) Palmer Bobo, NP    Location [facility]    Staff Present Sueellen Emery BS, ACSM-CEP, Exercise Physiologist;Casey Carmen Chol, RN, Shasta Deist, MS, ACSM-CEP, Exercise Physiologist    Virtual Visit No    Medication changes reported     No    Fall or balance concerns reported    No    Tobacco Cessation No Change    Warm-up and Cool-down Performed as group-led instruction    Resistance Training Performed Yes    VAD Patient? No    PAD/SET Patient? No      Pain Assessment   Currently in Pain? No/denies    Multiple Pain Sites No             Capillary Blood Glucose: No results found for this or any previous visit (from the past 24 hours).    Social History   Tobacco Use  Smoking Status Former   Types: Cigars   Quit date: 10/11/1998   Years since quitting: 24.8  Smokeless Tobacco Current   Types: Snuff  Tobacco Comments   50 years of snuff use    Goals Met:  Proper associated with RPD/PD & O2 Sat Independence with exercise equipment Exercise tolerated well No report of concerns or symptoms today Strength training completed today  Goals Unmet:  Not Applicable  Comments: Service time is from 0810 to 0919.    Dr. Genetta Kenning is Medical Director for Pulmonary Rehab at [facility].

## 2023-08-16 ENCOUNTER — Encounter (HOSPITAL_COMMUNITY)
Admission: RE | Admit: 2023-08-16 | Discharge: 2023-08-16 | Disposition: A | Discharge status: Home / Self Care | Source: Ambulatory Visit | Attending: Pulmonary Disease | Admitting: Pulmonary Disease

## 2023-08-16 DIAGNOSIS — J449 Chronic obstructive pulmonary disease, unspecified: Secondary | ICD-10-CM | POA: Diagnosis not present

## 2023-08-16 NOTE — Progress Notes (Signed)
 Daily Session Note  Patient Details  Name: Ernest Mckay MRN: [id number] Date of Birth: 05-05-1945 Referring Provider:   [name] Pulmonary Rehab Walk Test from 06/18/2023 in [facility] for Heart, Vascular, & Lung Health  Referring Provider [name]       Encounter Date: 08/16/2023  Check In:  Session Check In - 08/16/23 0857       Check-In   Supervising physician immediately available to respond to emergencies [facility] MD immediately available    Physician(s) Charles Connor, NP    Location [facility]    Staff Present Sueellen Emery BS, ACSM-CEP, Exercise Physiologist;Casey Carmen Chol, RN, Shasta Deist, MS, ACSM-CEP, Exercise Physiologist    Virtual Visit No    Medication changes reported     No    Fall or balance concerns reported    No    Tobacco Cessation No Change    Warm-up and Cool-down Performed as group-led instruction    Resistance Training Performed Yes    VAD Patient? No    PAD/SET Patient? No      Pain Assessment   Currently in Pain? No/denies    Multiple Pain Sites No             Capillary Blood Glucose: No results found for this or any previous visit (from the past 24 hours).    Social History   Tobacco Use  Smoking Status Former   Types: Cigars   Quit date: 10/11/1998   Years since quitting: 24.8  Smokeless Tobacco Current   Types: Snuff  Tobacco Comments   50 years of snuff use    Goals Met:  Independence with exercise equipment Exercise tolerated well No report of concerns or symptoms today Strength training completed today  Goals Unmet:  Not Applicable  Comments: Service time is from 0817 to 0932    Dr. Genetta Kenning is Medical Director for Pulmonary Rehab at [facility].

## 2023-08-21 ENCOUNTER — Encounter (HOSPITAL_COMMUNITY)
Admission: RE | Admit: 2023-08-21 | Discharge: 2023-08-21 | Disposition: A | Discharge status: Home / Self Care | Source: Ambulatory Visit | Attending: Pulmonary Disease

## 2023-08-21 VITALS — Wt 212.7 lb

## 2023-08-21 DIAGNOSIS — J449 Chronic obstructive pulmonary disease, unspecified: Secondary | ICD-10-CM

## 2023-08-21 NOTE — Progress Notes (Signed)
 Daily Session Note  Patient Details  Name: Ernest Mckay MRN: [id number] Date of Birth: Jan 27, 1946 Referring Provider:   [name] Pulmonary Rehab Walk Test from 06/18/2023 in [facility] for Heart, Vascular, & Lung Health  Referring Provider [name]       Encounter Date: 08/21/2023  Check In:  Session Check In - 08/21/23 7829       Check-In   Supervising physician immediately available to respond to emergencies [facility] MD immediately available    Physician(s) Charles Connor, NP    [facility]    Staff Present Sueellen Emery BS, ACSM-CEP, Exercise Physiologist;Mary Arlester Ladd, RN, Shasta Deist, MS, ACSM-CEP, Exercise Physiologist    Virtual Visit No    Medication changes reported     No    Fall or balance concerns reported    No    Tobacco Cessation No Change    Warm-up and Cool-down Performed as group-led instruction    Resistance Training Performed Yes    VAD Patient? No    PAD/SET Patient? No      Pain Assessment   Currently in Pain? No/denies             Capillary Blood Glucose: No results found for this or any previous visit (from the past 24 hours).   Exercise Prescription Changes - 08/21/23 0900       Response to Exercise   Blood Pressure (Admit) 122/70    Blood Pressure (Exercise) 142/72    Blood Pressure (Exit) 122/68    Heart Rate (Admit) 79 bpm    Heart Rate (Exercise) 108 bpm    Heart Rate (Exit) 84 bpm    Oxygen Saturation (Admit) 98 %    Oxygen Saturation (Exercise) 97 %    Oxygen Saturation (Exit) 95 %    Rating of Perceived Exertion (Exercise) 11    Perceived Dyspnea (Exercise) 0    Duration Continue with 30 min of aerobic exercise without signs/symptoms of physical distress.    Intensity THRR unchanged      Progression   Progression Continue to progress workloads to maintain intensity without signs/symptoms of physical distress.      Resistance Training   Training Prescription Yes    Weight  black bands    Reps 10-15    Time 10 Minutes      Treadmill   MPH 2.2    Grade 3.5    Minutes 15    METs 3.4      Bike   Level 5    Minutes 15    METs 3.9             Social History   Tobacco Use  Smoking Status Former   Types: Cigars   Quit date: 10/11/1998   Years since quitting: 24.8  Smokeless Tobacco Current   Types: Snuff  Tobacco Comments   50 years of snuff use    Goals Met:  Independence with exercise equipment Exercise tolerated well No report of concerns or symptoms today Strength training completed today  Goals Unmet:  Not Applicable  Comments: Service time is from 0800 to 0925.    Dr. Genetta Kenning is Medical Director for Pulmonary Rehab at [facility].

## 2023-08-23 ENCOUNTER — Encounter (HOSPITAL_COMMUNITY)
Admission: RE | Admit: 2023-08-23 | Discharge: 2023-08-23 | Disposition: A | Discharge status: Home / Self Care | Source: Ambulatory Visit | Attending: Pulmonary Disease

## 2023-08-23 DIAGNOSIS — J449 Chronic obstructive pulmonary disease, unspecified: Secondary | ICD-10-CM

## 2023-08-23 NOTE — Progress Notes (Signed)
 Daily Session Note  Patient Details  Name: Nick Stults MRN: [id number] Date of Birth: Aug 10, 1945 Referring Provider:   [name] Pulmonary Rehab Walk Test from 06/18/2023 in [facility]  Referring Provider [name]       Encounter Date: 08/23/2023  Check In:  Session Check In - 08/23/23 4098       Check-In   Supervising physician immediately available to respond to emergencies [facility] MD immediately available    Physician(s) Marlana Silvan, NP    Location [facility]    Staff Present Sueellen Emery BS, ACSM-CEP, Exercise Physiologist;Mary Arlester Ladd, RN, Shasta Deist, MS, ACSM-CEP, Exercise Physiologist;Casey Felipe Horton, RT    Virtual Visit No    Medication changes reported     No    Fall or balance concerns reported    No    Tobacco Cessation No Change    Warm-up and Cool-down Performed as group-led instruction    Resistance Training Performed Yes    VAD Patient? No    PAD/SET Patient? No      Pain Assessment   Currently in Pain? No/denies             Capillary Blood Glucose: No results found for this or any previous visit (from the past 24 hours).    Social History   Tobacco Use  Smoking Status Former   Types: Cigars   Quit date: 10/11/1998   Years since quitting: 24.8  Smokeless Tobacco Current   Types: Snuff  Tobacco Comments   50 years of snuff use    Goals Met:  Independence with exercise equipment Exercise tolerated well No report of concerns or symptoms today Strength training completed today  Goals Unmet:  Not Applicable  Comments: Service time is from 0802 to 0934.    Dr. Genetta Kenning is Medical Director for Pulmonary Rehab at [facility].

## 2023-08-28 ENCOUNTER — Encounter (HOSPITAL_COMMUNITY)
Admission: RE | Admit: 2023-08-28 | Discharge: 2023-08-28 | Disposition: A | Discharge status: Home / Self Care | Source: Ambulatory Visit | Attending: Pulmonary Disease | Admitting: Pulmonary Disease

## 2023-08-28 DIAGNOSIS — J449 Chronic obstructive pulmonary disease, unspecified: Secondary | ICD-10-CM | POA: Insufficient documentation

## 2023-08-28 NOTE — Progress Notes (Signed)
 Daily Session Note  Patient Details  Name: Ernest Mckay MRN: [id number] Date of Birth: 1945/06/27 Referring Provider:   [name] Pulmonary Rehab Walk Test from 06/18/2023 in [facility]  Referring Provider [name]       Encounter Date: 08/28/2023  Check In:  Session Check In - 08/28/23 8657       Check-In   Supervising physician immediately available to respond to emergencies [facility] MD immediately available    Physician(s) Koren Persons, NP    [facility]    Staff Present Sueellen Emery BS, ACSM-CEP, Exercise Physiologist;Kaylee Nolon Baxter, MS, ACSM-CEP, Exercise Physiologist;Anzleigh Slaven Felipe Horton, RT    Virtual Visit No    Medication changes reported     No    Fall or balance concerns reported    No    Tobacco Cessation No Change    Warm-up and Cool-down Performed as group-led instruction    Resistance Training Performed Yes    VAD Patient? No    PAD/SET Patient? No      Pain Assessment   Currently in Pain? No/denies    Multiple Pain Sites No             Capillary Blood Glucose: No results found for this or any previous visit (from the past 24 hours).    Social History   Tobacco Use  Smoking Status Former   Types: Cigars   Quit date: 10/11/1998   Years since quitting: 24.8  Smokeless Tobacco Current   Types: Snuff  Tobacco Comments   50 years of snuff use    Goals Met:  Proper associated with RPD/PD & O2 Sat Independence with exercise equipment Exercise tolerated well No report of concerns or symptoms today Strength training completed today  Goals Unmet:  Not Applicable  Comments: Service time is from 0816 to [phone].    Dr. Genetta Kenning is Medical Director for Pulmonary Rehab at [facility].

## 2023-08-30 ENCOUNTER — Encounter (HOSPITAL_COMMUNITY)
Admission: RE | Admit: 2023-08-30 | Discharge: 2023-08-30 | Disposition: A | Discharge status: Home / Self Care | Source: Ambulatory Visit | Attending: Pulmonary Disease | Admitting: Pulmonary Disease

## 2023-08-30 VITALS — Wt 211.4 lb

## 2023-08-30 DIAGNOSIS — J449 Chronic obstructive pulmonary disease, unspecified: Secondary | ICD-10-CM

## 2023-08-30 NOTE — Progress Notes (Signed)
 Home Exercise Prescription I have reviewed a Home Exercise Prescription with Ernest Mckay.  He is currently walking 5-7 days/wk for 15-20 min/day. Recommended increasing to 30 min/day. He agreed with recommendations. Discussed weather precuations with patient. He agreed with recommendations. I am confident in Pt completing exercise regimen at home. The patient stated that their goals were to lose weight. We reviewed exercise guidelines, target heart rate during exercise, RPE Scale, weather conditions, endpoints for exercise, warmup and cool down. The patient is encouraged to come to me with any questions. I will continue to follow up with the patient to assist them with progression and safety. Spent 15 min with patient discussing home exercise plan and goals  Ernest Huron, MS, ACSM-CEP 08/30/2023 9:48 AM

## 2023-08-30 NOTE — Progress Notes (Signed)
 Daily Session Note  Patient Details  Name: Ernest Mckay MRN: [id number] Date of Birth: Jan 21, 1946 Referring Provider:   [name] Pulmonary Rehab Walk Test from 06/18/2023 in [facility], Vascular, & Lung [facility]  Referring Provider [name]       Encounter Date: 08/30/2023  Check In:  Session Check In - 08/30/23 4098       Check-In   Supervising physician immediately available to respond to emergencies [facility] MD immediately available    Physician(s) Palmer Bobo NP    Location [facility]    Staff Present Sueellen Emery BS, ACSM-CEP, Exercise Physiologist;Kaylee Nolon Baxter, MS, ACSM-CEP, Exercise Physiologist;Janssen Zee Felipe Horton, RT    Virtual Visit No    Medication changes reported     No    Fall or balance concerns reported    No    Tobacco Cessation No Change    Warm-up and Cool-down Performed as group-led instruction    Resistance Training Performed Yes    VAD Patient? No    PAD/SET Patient? No      Pain Assessment   Currently in Pain? No/denies             Capillary Blood Glucose: No results found for this or any previous visit (from the past 24 hours).    Social History   Tobacco Use  Smoking Status Former   Types: Cigars   Quit date: 10/11/1998   Years since quitting: 24.9  Smokeless Tobacco Current   Types: Snuff  Tobacco Comments   50 years of snuff use    Goals Met:  Proper associated with RPD/PD & O2 Sat Independence with exercise equipment Exercise tolerated well No report of concerns or symptoms today Strength training completed today  Goals Unmet:  Not Applicable  Comments: Service time is from 0808 to 0930.    Dr. Genetta Kenning is Medical Director for Pulmonary Rehab at [facility].

## 2023-09-04 ENCOUNTER — Encounter (HOSPITAL_COMMUNITY)

## 2023-09-05 NOTE — Progress Notes (Signed)
 Pulmonary Individual Treatment Plan  Patient Details  Name: Ernest Mckay MRN: [id number] Date of Birth: 12-06-1945 Referring Provider:   [name] Pulmonary Rehab Walk Test from 06/18/2023 in [facility]  Referring Provider [name]       Initial Encounter Date:  Flowsheet Row Pulmonary Rehab Walk Test from 06/18/2023 in 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 (Patient not taking: Reported on 06/18/2023), Disp: 30 tablet, Rfl: 3   olopatadine (PATANOL) 0.1 % ophthalmic solution, Place 1 drop into both eyes 2 (two) times daily., Disp: , Rfl:    omeprazole (PRILOSEC) 40 MG capsule, Take 40 mg by mouth in the morning and at bedtime. (Patient not taking: Reported on 06/18/2023), Disp: , Rfl:    rosuvastatin (CRESTOR) 5 MG tablet, Take 5 mg by mouth daily., Disp: , Rfl:    Tiotropium Bromide-Olodaterol 2.5-2.5 MCG/ACT AERS, Inhale 2.5 mcg into the lungs daily. 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   Steatosis of liver 06/15/2023    Tobacco Use: Social History   Tobacco Use  Smoking Status Former   Types: Cigars   Quit date: 10/11/1998   Years since quitting: 24.9  Smokeless Tobacco Current   Types: Snuff  Tobacco Comments   50 years of snuff use    Labs: Review Flowsheet  More data may exist      Latest Ref Rng & Units 10/02/2013 10/19/2016 10/10/2017 10/09/2018 10/08/2019  Labs for ITP Cardiac and Pulmonary Rehab  Cholestrol 100 - 199 mg/dL 409  811  914  782  956   LDL (calc) 0 - 99 mg/dL 213  086  89  578  52   HDL-C >39 mg/dL 80  71  88  62  469   Trlycerides 0 - 149 mg/dL 56  75  52  67  53   Hemoglobin A1c 4.0 - 5.6 % - - 5.4  5.7  5.3     Capillary Blood Glucose: No results found for: GLUCAP   Pulmonary Assessment Scores:  Pulmonary Assessment Scores     Row Name 06/18/23 1047         ADL UCSD   ADL Phase Entry     SOB Score total 37       CAT Score   CAT Score 19       mMRC Score   mMRC Score 1  UCSD: Self-administered rating of dyspnea associated with activities of daily living (ADLs) 6-point scale (0 = not at all to 5 = maximal or unable to do because of breathlessness)  Scoring Scores range from 0 to 120.  Minimally important difference is 5 units  CAT: CAT can identify the health impairment of COPD patients and is better correlated with disease progression.  CAT has a scoring range of zero to 40. The CAT score is classified into four groups of low (less than 10), medium (10 - 20), high (21-30) and very high (31-40) based on the impact level of disease on health status. A CAT score over 10 suggests significant symptoms.  A worsening CAT score could be explained by an exacerbation, poor medication adherence, poor inhaler technique, or progression of COPD or comorbid conditions.  CAT MCID is 2 points  mMRC: mMRC (Modified Medical Research Council) Dyspnea Scale is used to assess the degree of  baseline functional disability in patients of respiratory disease Mckay to dyspnea. No minimal important difference is established. A decrease in score of 1 point or greater is considered a positive change.   Pulmonary Function Assessment:   Exercise Target Goals: Exercise Program Goal: Individual exercise prescription set using results from initial 6 min walk test and THRR while considering  patient's activity barriers and safety.   Exercise Prescription Goal: Initial exercise prescription builds to 30-45 minutes a day of aerobic activity, 2-3 days per week.  Home exercise guidelines will be given to patient during program as part of exercise prescription that the participant will acknowledge.  Activity Barriers & Risk Stratification:  Activity Barriers & Cardiac Risk Stratification - 06/18/23 1052       Activity Barriers & Cardiac Risk Stratification   Activity Barriers Deconditioning;Muscular Weakness;Shortness of Breath;Back Problems;Arthritis    Cardiac Risk Stratification Moderate             6 Minute Walk:  6 Minute Walk     Row Name 06/18/23 1102 06/18/23 1130       6 Minute Walk   Phase -- Initial    Distance -- 1220 feet    Walk Time -- 6 minutes    # of Rest Breaks -- 0    MPH -- 2.31    METS -- 2.88    RPE -- 11    Perceived Dyspnea  -- 3    VO2 Peak -- 10.08    Symptoms -- No    Resting HR -- 90 bpm    Resting BP -- 140/72    Resting Oxygen Saturation  -- 97 %    Exercise Oxygen Saturation  during 6 min walk -- 97 %    Max Ex. HR -- 123 bpm    Max Ex. BP -- 176/70    2 Minute Post BP -- 142/70      Interval HR   1 Minute HR -- 110    2 Minute HR -- 118    3 Minute HR -- 119    4 Minute HR -- 118    5 Minute HR -- 120    6 Minute HR -- 123    2 Minute Post HR -- 95    Interval Heart Rate? -- Yes      Interval Oxygen   Interval Oxygen? -- Yes    Baseline Oxygen Saturation % -- 97 %    1 Minute Oxygen Saturation % -- 97 %    1 Minute Liters of  Oxygen -- 0  L    2 Minute Oxygen Saturation % -- 97 %    2 Minute Liters of Oxygen -- 0 L    3 Minute Oxygen Saturation % -- 97 %    3 Minute Liters of Oxygen -- 0 L    4 Minute Oxygen Saturation % -- 97 %    4 Minute Liters of Oxygen -- 0 L    5 Minute Oxygen Saturation % -- 97 %    5 Minute Liters of Oxygen -- 0 L    6 Minute Oxygen Saturation % -- 98 %    6 Minute Liters of Oxygen -- 0 L    2 Minute Post Oxygen Saturation % -- 98 %    2 Minute Post Liters of Oxygen -- 0 L             Oxygen Initial Assessment:  Oxygen Initial Assessment - 06/18/23 1046       Home Oxygen   Home Oxygen Device None    Sleep Oxygen Prescription None    Home Exercise Oxygen Prescription None    Compliance with Home Oxygen Use Yes      Initial 6 min Walk   Oxygen Used None      Program Oxygen Prescription   Program Oxygen Prescription None      Intervention   Short Term Goals To learn and understand importance of maintaining oxygen saturations>88%;To learn and demonstrate proper use of respiratory medications;To learn and understand importance of monitoring SPO2 with pulse oximeter and demonstrate accurate use of the pulse oximeter.;To learn and demonstrate proper pursed lip breathing techniques or other breathing techniques.     Long  Term Goals Verbalizes importance of monitoring SPO2 with pulse oximeter and return demonstration;Maintenance of O2 saturations>88%;Exhibits proper breathing techniques, such as pursed lip breathing or other method taught during program session;Demonstrates proper use of MDI's;Compliance with respiratory medication             Oxygen Re-Evaluation:  Oxygen Re-Evaluation     Row Name 07/02/23 0950 08/01/23 0922 08/29/23 0923         Program Oxygen Prescription   Program Oxygen Prescription None None None       Home Oxygen   Home Oxygen Device None None None     Sleep Oxygen Prescription None None None     Home Exercise Oxygen Prescription None None  None     Compliance with Home Oxygen Use Yes Yes Yes       Goals/Expected Outcomes   Short Term Goals To learn and understand importance of maintaining oxygen saturations>88%;To learn and demonstrate proper use of respiratory medications;To learn and understand importance of monitoring SPO2 with pulse oximeter and demonstrate accurate use of the pulse oximeter.;To learn and demonstrate proper pursed lip breathing techniques or other breathing techniques.  To learn and understand importance of maintaining oxygen saturations>88%;To learn and demonstrate proper use of respiratory medications;To learn and understand importance of monitoring SPO2 with pulse oximeter and demonstrate accurate use of the pulse oximeter.;To learn and demonstrate proper pursed lip breathing techniques or other breathing techniques.  To learn and understand importance of maintaining oxygen saturations>88%;To learn and demonstrate proper use of respiratory medications;To learn and understand importance of monitoring SPO2 with pulse oximeter and demonstrate accurate use of the pulse oximeter.;To learn and demonstrate proper pursed lip breathing techniques or other breathing techniques.      Long  Term Goals Verbalizes importance of monitoring SPO2 with pulse oximeter and return demonstration;Maintenance of  O2 saturations>88%;Exhibits proper breathing techniques, such as pursed lip breathing or other method taught during program session;Demonstrates proper use of MDI's;Compliance with respiratory medication Verbalizes importance of monitoring SPO2 with pulse oximeter and return demonstration;Maintenance of O2 saturations>88%;Exhibits proper breathing techniques, such as pursed lip breathing or other method taught during program session;Demonstrates proper use of MDI's;Compliance with respiratory medication Verbalizes importance of monitoring SPO2 with pulse oximeter and return demonstration;Maintenance of O2 saturations>88%;Exhibits proper  breathing techniques, such as pursed lip breathing or other method taught during program session;Demonstrates proper use of MDI's;Compliance with respiratory medication     Goals/Expected Outcomes Compliance and understanding of oxygen saturation monitoring and breathing techniques to decrease shortness of breath. Compliance and understanding of oxygen saturation monitoring and breathing techniques to decrease shortness of breath. Compliance and understanding of oxygen saturation monitoring and breathing techniques to decrease shortness of breath.              Oxygen Discharge (Final Oxygen Re-Evaluation):  Oxygen Re-Evaluation - 08/29/23 0923       Program Oxygen Prescription   Program Oxygen Prescription None      Home Oxygen   Home Oxygen Device None    Sleep Oxygen Prescription None    Home Exercise Oxygen Prescription None    Compliance with Home Oxygen Use Yes      Goals/Expected Outcomes   Short Term Goals To learn and understand importance of maintaining oxygen saturations>88%;To learn and demonstrate proper use of respiratory medications;To learn and understand importance of monitoring SPO2 with pulse oximeter and demonstrate accurate use of the pulse oximeter.;To learn and demonstrate proper pursed lip breathing techniques or other breathing techniques.     Long  Term Goals Verbalizes importance of monitoring SPO2 with pulse oximeter and return demonstration;Maintenance of O2 saturations>88%;Exhibits proper breathing techniques, such as pursed lip breathing or other method taught during program session;Demonstrates proper use of MDI's;Compliance with respiratory medication    Goals/Expected Outcomes Compliance and understanding of oxygen saturation monitoring and breathing techniques to decrease shortness of breath.             Initial Exercise Prescription:  Initial Exercise Prescription - 06/18/23 1100       Date of Initial Exercise RX and Referring Provider   Date  06/18/23    Referring Provider [name]    Expected Discharge Date 09/13/23      Treadmill   MPH 2    Grade 0    Minutes 15    METs 2.5      Bike   Level 2    Minutes 15    METs 2.5      Prescription Details   Frequency (times per week) 2    Duration Progress to 30 minutes of continuous aerobic without signs/symptoms of physical distress      Intensity   THRR 40-80% of Max Heartrate 58-115    Ratings of Perceived Exertion 11-13    Perceived Dyspnea 0-4      Progression   Progression Continue to progress workloads to maintain intensity without signs/symptoms of physical distress.      Resistance Training   Training Prescription Yes    Weight black bands    Reps 10-15             Perform Capillary Blood Glucose checks as needed.  Exercise Prescription Changes:   Exercise Prescription Changes     Row Name 06/26/23 0900 07/10/23 0900 07/24/23 0900 07/24/23 0957 08/07/23 0900     Response to Exercise  Blood Pressure (Admit) 130/68 132/70 122/68 122/68 112/64   Blood Pressure (Exercise) 154/80 148/70 142/66 142/66 140/70   Blood Pressure (Exit) 112/68 120/70 112/70 112/70 130/64   Heart Rate (Admit) 92 bpm 93 bpm 85 bpm 85 bpm 87 bpm   Heart Rate (Exercise) 124 bpm 114 bpm 110 bpm 110 bpm 115 bpm   Heart Rate (Exit) 100 bpm 100 bpm 87 bpm 87 bpm 93 bpm   Oxygen Saturation (Admit) 99 % 97 % 99 % 99 % 98 %   Oxygen Saturation (Exercise) 98 % 97 % 97 % 97 % 99 %   Oxygen Saturation (Exit) 97 % 97 % 97 % 97 % 97 %   Rating of Perceived Exertion (Exercise) 14 13 12 12 11    Perceived Dyspnea (Exercise) 2 1 3 3 3    Duration Progress to 30 minutes of  aerobic without signs/symptoms of physical distress Continue with 30 min of aerobic exercise without signs/symptoms of physical distress. Continue with 30 min of aerobic exercise without signs/symptoms of physical distress. Continue with 30 min of aerobic exercise without signs/symptoms of physical distress. Continue with 30  min of aerobic exercise without signs/symptoms of physical distress.   Intensity THRR unchanged THRR unchanged THRR unchanged THRR unchanged THRR unchanged     Progression   Progression Continue to progress workloads to maintain intensity without signs/symptoms of physical distress. Continue to progress workloads to maintain intensity without signs/symptoms of physical distress. Continue to progress workloads to maintain intensity without signs/symptoms of physical distress. Continue to progress workloads to maintain intensity without signs/symptoms of physical distress. Continue to progress workloads to maintain intensity without signs/symptoms of physical distress.     Resistance Training   Training Prescription Yes Yes Yes Yes Yes   Weight black bands black bands black bands black bands black bands   Reps 10-15 10-15 10-15 10-15 10-15   Time 10 Minutes 10 Minutes 10 Minutes 10 Minutes 10 Minutes     Treadmill   MPH 1.6 -- 1.8 1.8 1.7   Grade 0 -- 0 0 2   Minutes 15 -- 15 15 15    METs 2.1 -- 2.2 2.2 2.6     Bike   Level 2 3 4 4 4    Watts -- 60 54 54 --   Minutes 15 15 69 69 15   METs 2.2 3 3.2 3.2 3.1     Track   Laps -- 12 -- -- --   Minutes -- 15 -- -- --   METs -- 3.31 -- -- --    Row Name 08/21/23 0900 08/30/23 0900           Response to Exercise   Blood Pressure (Admit) 122/70 110/60      Blood Pressure (Exercise) 142/72 --      Blood Pressure (Exit) 122/68 104/70      Heart Rate (Admit) 79 bpm 87 bpm      Heart Rate (Exercise) 108 bpm 124 bpm      Heart Rate (Exit) 84 bpm 89 bpm      Oxygen Saturation (Admit) 98 % 96 %      Oxygen Saturation (Exercise) 97 % 97 %      Oxygen Saturation (Exit) 95 % 97 %      Rating of Perceived Exertion (Exercise) 11 9      Perceived Dyspnea (Exercise) 0 0      Duration Continue with 30 min of aerobic exercise without signs/symptoms of physical distress. Continue with  30 min of aerobic exercise without signs/symptoms of physical  distress.      Intensity THRR unchanged THRR unchanged        Progression   Progression Continue to progress workloads to maintain intensity without signs/symptoms of physical distress. Continue to progress workloads to maintain intensity without signs/symptoms of physical distress.        Resistance Training   Training Prescription Yes Yes      Weight black bands black bands      Reps 10-15 10-15      Time 10 Minutes 10 Minutes        Treadmill   MPH 2.2 2.2      Grade 3.5 3.5      Minutes 15 15      METs 3.4 3.4        Bike   Level 5 5      Minutes 15 15      METs 3.9 4        Home Exercise Plan   Plans to continue exercise at -- Home (comment)      Frequency -- --  N/A      Initial Home Exercises Provided -- 08/30/23               Exercise Comments:   Exercise Comments     Row Name 06/26/23 1638 08/30/23 0944         Exercise Comments Ernest Mckay completed his first day of exercise. He exercised for 15 min on the treadmill and upright bike. Pt averaged 2.1 METs at 1.6 mph on the treadmill and 2.2 METs at level 2 on the upright bike. He performed the warmup and cooldown standing with some struggle. Will have verbal cues next time. I am also considering transfering him to the track. Ernest Mckay looks somewhat unsteady on the treadmill. Completed home exercise plan. He is currently walking 5-7 days/wk for 15-20 min/day. Recommended increasing to 30 min/day. He agreed with recommendations. Discussed weather precuations with patient. He agreed with recommendations. I am confident in Pt completing exercise regimen at home.               Exercise Goals and Review:   Exercise Goals     Row Name 06/18/23 1053             Exercise Goals   Increase Physical Activity Yes       Intervention Provide advice, education, support and counseling about physical activity/exercise needs.;Develop an individualized exercise prescription for aerobic and resistive training based on  initial evaluation findings, risk stratification, comorbidities and participant's personal goals.       Expected Outcomes Short Term: Attend rehab on a regular basis to increase amount of physical activity.;Long Term: Add in home exercise to make exercise part of routine and to increase amount of physical activity.;Long Term: Exercising regularly at least 3-5 days a week.       Increase Strength and Stamina Yes       Intervention Provide advice, education, support and counseling about physical activity/exercise needs.;Develop an individualized exercise prescription for aerobic and resistive training based on initial evaluation findings, risk stratification, comorbidities and participant's personal goals.       Expected Outcomes Short Term: Increase workloads from initial exercise prescription for resistance, speed, and METs.;Short Term: Perform resistance training exercises routinely during rehab and add in resistance training at home;Long Term: Improve cardiorespiratory fitness, muscular endurance and strength as measured by increased METs and functional capacity ( )  Able to understand and use rate of perceived exertion (RPE) scale Yes       Intervention Provide education and explanation on how to use RPE scale       Expected Outcomes Short Term: Able to use RPE daily in rehab to express subjective intensity level;Long Term:  Able to use RPE to guide intensity level when exercising independently       Able to understand and use Dyspnea scale Yes       Intervention Provide education and explanation on how to use Dyspnea scale       Expected Outcomes Short Term: Able to use Dyspnea scale daily in rehab to express subjective sense of shortness of breath during exertion;Long Term: Able to use Dyspnea scale to guide intensity level when exercising independently       Knowledge and understanding of Target Heart Rate Range (THRR) Yes       Intervention Provide education and explanation of THRR  including how the numbers were predicted and where they are located for reference       Expected Outcomes Short Term: Able to state/look up THRR;Long Term: Able to use THRR to govern intensity when exercising independently;Short Term: Able to use daily as guideline for intensity in rehab       Understanding of Exercise Prescription Yes       Intervention Provide education, explanation, and written materials on patient's individual exercise prescription       Expected Outcomes Short Term: Able to explain program exercise prescription;Long Term: Able to explain home exercise prescription to exercise independently                Exercise Goals Re-Evaluation :  Exercise Goals Re-Evaluation     Row Name 07/02/23 0948 08/01/23 0919 08/29/23 0921         Exercise Goal Re-Evaluation   Exercise Goals Review Increase Physical Activity;Able to understand and use Dyspnea scale;Understanding of Exercise Prescription;Increase Strength and Stamina;Knowledge and understanding of Target Heart Rate Range (THRR);Able to understand and use rate of perceived exertion (RPE) scale Increase Physical Activity;Able to understand and use Dyspnea scale;Understanding of Exercise Prescription;Increase Strength and Stamina;Knowledge and understanding of Target Heart Rate Range (THRR);Able to understand and use rate of perceived exertion (RPE) scale Increase Physical Activity;Able to understand and use Dyspnea scale;Understanding of Exercise Prescription;Increase Strength and Stamina;Knowledge and understanding of Target Heart Rate Range (THRR);Able to understand and use rate of perceived exertion (RPE) scale     Comments Johnatan has completed 2 exercise sessions. He exercises for 15 on the track and upright bike. Ernest Mckay averages 2.69 METs on the track and 2.8 METs at level 2 on the upright bike. He performs the warmup and cooldown standing with some struggle. Ernest Mckay was on the treadmill but had to be transferred to the track  Mckay to gait instability. Will continue to monitor and progress as able. Ernest Mckay has completed 11 exercise sessions. He exercises for 15 on the treadmill and upright bike. Ernest Mckay averages 2.5 METs at 1.8 mph and 1% incline on the treadmill and 3.6 METs at level 4 on the upright bike. He performs the warmup and cooldown standing without limitations. Warmup and cooldown exercises have improved. Ernest Mckay has also been progressed to the treadmill. He tolerates the treadmill fair and walks at a slow place. Will increase treadmill speed when he tolerates it better. Will continue to monitor and progress as able. Ernest Mckay has completed 19 exercise sessions. He exercises for 15 on the treadmill and upright  bike. Ernest Mckay averages 3.8 METs at 2.2 mph and 4% incline on the treadmill and 4 METs at level  on the upright bike. He performs the warmup and cooldown standing without limitations. Ernest Mckay has increased his speed and incline on the treadmill and level on the upright bike. METs have significantly increased as Ernest Mckay tolerates progressions well. Will discuss home exericse soon. Will continue to monitor and progress as able.     Expected Outcomes Through exercise at rehab and home, the patient will decrease shortness of breath with daily activities and feel confident in carrying out an exercise regimen at home. Through exercise at rehab and home, the patient will decrease shortness of breath with daily activities and feel confident in carrying out an exercise regimen at home. Through exercise at rehab and home, the patient will decrease shortness of breath with daily activities and feel confident in carrying out an exercise regimen at home.              Discharge Exercise Prescription (Final Exercise Prescription Changes):  Exercise Prescription Changes - 08/30/23 0900       Response to Exercise   Blood Pressure (Admit) 110/60    Blood Pressure (Exit) 104/70    Heart Rate (Admit) 87 bpm    Heart Rate (Exercise) 124  bpm    Heart Rate (Exit) 89 bpm    Oxygen Saturation (Admit) 96 %    Oxygen Saturation (Exercise) 97 %    Oxygen Saturation (Exit) 97 %    Rating of Perceived Exertion (Exercise) 9    Perceived Dyspnea (Exercise) 0    Duration Continue with 30 min of aerobic exercise without signs/symptoms of physical distress.    Intensity THRR unchanged      Progression   Progression Continue to progress workloads to maintain intensity without signs/symptoms of physical distress.      Resistance Training   Training Prescription Yes    Weight black bands    Reps 10-15    Time 10 Minutes      Treadmill   MPH 2.2    Grade 3.5    Minutes 15    METs 3.4      Bike   Level 5    Minutes 15    METs 4      Home Exercise Plan   Plans to continue exercise at Home (comment)    Frequency --   N/A   Initial Home Exercises Provided 08/30/23             Nutrition:  Target Goals: Understanding of nutrition guidelines, daily intake of sodium 1500mg , cholesterol 200mg , calories 30% from fat and 7% or less from saturated fats, daily to have 5 or more servings of fruits and vegetables.  Biometrics:  Pre Biometrics - 06/18/23 1101       Pre Biometrics   Grip Strength 38 kg              Nutrition Therapy Plan and Nutrition Goals:  Nutrition Therapy & Goals - 08/30/23 0847       Nutrition Therapy   Diet Heart Healthy diet    Drug/Food Interactions Statins/Certain Fruits      Personal Nutrition Goals   Nutrition Goal Patient to improve diet quality by using the plate method as a guide for meal planning to include lean protein/plant protein, fruits, vegetables, whole grains, nonfat dairy as part of a well-balanced diet.   goal in progress.   Personal Goal #2 Patient to identify strategies for  weight loss of 0.5-2.0# per week.   goal in progress.   Comments Goals in progress. 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 Lipids WNL. He is motivated to lose weight with goal  weight ~200#. We have discussed multiple strategies for weight loss including calorie density, the plate method as a guide for meal planning, benefits of high fiber/high protein intake, etc. He is down 6.6# since starting with our program. Patient will continue to benefit from participation in pulmonary rehab for nutrition, exercise, and lifestyle modification support.      Intervention Plan   Intervention Prescribe, educate and counsel regarding individualized specific dietary modifications aiming towards targeted core components such as weight, hypertension, lipid management, diabetes, heart failure and other comorbidities.;Nutrition handout(s) given to patient.    Expected Outcomes Short Term Goal: Understand basic principles of dietary content, such as calories, fat, sodium, cholesterol and nutrients.;Long Term Goal: Adherence to prescribed nutrition plan.             Nutrition Assessments:  Nutrition Assessments - 06/26/23 0919       Rate Your Plate Scores   Pre Score 41            MEDIFICTS Score Key: >=70 Need to make dietary changes  40-70 Heart Healthy Diet <= 40 Therapeutic Level Cholesterol Diet  Flowsheet Row PULMONARY REHAB CHRONIC OBSTRUCTIVE PULMONARY DISEASE from 06/26/2023 in [facility]  Picture Your Plate Total Score on Admission 41      Picture Your Plate Scores: <21 Unhealthy dietary pattern with much room for improvement. 41-50 Dietary pattern unlikely to meet recommendations for good health and room for improvement. 51-60 More healthful dietary pattern, with some room for improvement.  >60 Healthy dietary pattern, although there may be some specific behaviors that could be improved.    Nutrition Goals Re-Evaluation:  Nutrition Goals Re-Evaluation     Row Name 06/26/23 1134 07/26/23 1008 08/30/23 0847         Goals   Current Weight 218 lb 0.6 oz (98.9 kg) 215 lb 9.8 oz (97.8 kg) 211 lb 6.7  oz (95.9 kg)     Comment Most recent labs from 2021-lipids WNL, LDL 52, A1c WNL. [facility] labs not available for review in care everywhere A1c 6.1, lipids WNL no new labs; most recent labs A1c 6.1, lipids WNL     Expected Outcome Tayvian has medical history of COPD1, HTN, hyperlipidemia. Lipids WNL. Patient will benefit from participation inpulmonary rehab for nutrition, exercise, and lifestyle modification support. Goals in progress. 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 Lipids WNL. He is motivated to lose weight with goal weight ~200#. A1c is in a prediabetic range. We have discussed multiple strategies for weight loss including calorie density, the plate method as a guide for meal planning, benefits of high fiber/high protein intake, etc. He is down 2.4# since starting with our program. Patient will benefit from participation inpulmonary rehab for nutrition, exercise, and lifestyle modification support. Goals in progress. 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 Lipids WNL. He is motivated to lose weight with goal weight ~200#. We have discussed multiple strategies for weight loss including calorie density, the plate method as a guide for meal planning, benefits of high fiber/high protein intake, etc. He is down 6.6# since starting with our program. Patient will continue to benefit from participation in pulmonary rehab for nutrition, exercise, and lifestyle modification support.  Nutrition Goals Discharge (Final Nutrition Goals Re-Evaluation):  Nutrition Goals Re-Evaluation - 08/30/23 0847       Goals   Current Weight 211 lb 6.7 oz (95.9 kg)    Comment no new labs; most recent labs A1c 6.1, lipids WNL    Expected Outcome Goals in progress. 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 Lipids WNL. He is motivated to lose weight with goal weight ~200#. We have discussed multiple strategies for weight loss including calorie density, the plate  method as a guide for meal planning, benefits of high fiber/high protein intake, etc. He is down 6.6# since starting with our program. Patient will continue to benefit from participation in pulmonary rehab for nutrition, exercise, and lifestyle modification support.             Psychosocial: Target Goals: Acknowledge presence or absence of significant depression and/or stress, maximize coping skills, provide positive support system. Participant is able to verbalize types and ability to use techniques and skills needed for reducing stress and depression.  Initial Review & Psychosocial Screening:  Initial Psych Review & Screening - 06/18/23 1041       Initial Review   Current issues with None Identified      Family Dynamics   Good Support System? Yes    Comments wife, Lenon Radar      Barriers   Psychosocial barriers to participate in program The patient should benefit from training in stress management and relaxation.      Screening Interventions   Interventions Encouraged to exercise    Expected Outcomes Short Term goal: Utilizing psychosocial counselor, staff and physician to assist with identification of specific Stressors or current issues interfering with healing process. Setting desired goal for each stressor or current issue identified.;Long Term Goal: Stressors or current issues are controlled or eliminated.;Short Term goal: Identification and review with participant of any Quality of Life or Depression concerns found by scoring the questionnaire.;Long Term goal: The participant improves quality of Life and PHQ9 Scores as seen by post scores and/or verbalization of changes             Quality of Life Scores:  Scores of 19 and below usually indicate a poorer quality of life in these areas.  A difference of  2-3 points is a clinically meaningful difference.  A difference of 2-3 points in the total score of the Quality of Life Index has been associated with significant improvement  in overall quality of life, self-image, physical symptoms, and general health in studies assessing change in quality of life.  PHQ-9: Review Flowsheet  More data may exist      06/18/2023 10/08/2019 10/09/2018 10/10/2017 10/19/2016  Depression screen PHQ 2/9  Decreased Interest 0 1 0 0 0 0  Down, Depressed, Hopeless 1 0 0 0 0 0  PHQ - 2 Score 1 1 0 0 0 0  Altered sleeping 1 - - - -  Tired, decreased energy 2 - - - -  Change in appetite 0 - - - -  Feeling bad or failure about yourself  0 - - - -  Trouble concentrating 1 - - - -  Moving slowly or fidgety/restless 1 - - - -  Suicidal thoughts 0 - - - -  PHQ-9 Score 6 - - - -  Difficult doing work/chores Somewhat difficult - - - -    Details       Multiple values from one day are sorted in reverse-chronological order  Interpretation of Total Score  Total Score Depression Severity:  1-4 = Minimal depression, 5-9 = Mild depression, 10-14 = Moderate depression, 15-19 = Moderately severe depression, 20-27 = Severe depression   Psychosocial Evaluation and Intervention:  Psychosocial Evaluation - 06/18/23 1044       Psychosocial Evaluation & Interventions   Interventions Encouraged to exercise with the program and follow exercise prescription    Comments only stress is when he doesn't get his to-do list done    Expected Outcomes For pt to participate in PR    Continue Psychosocial Services  No Follow up required             Psychosocial Re-Evaluation:  Psychosocial Re-Evaluation     Row Name 07/04/23 1045 07/30/23 1017 08/27/23 0938         Psychosocial Re-Evaluation   Current issues with None Identified None Identified None Identified     Comments Psychosocial monthly re-evaluation is as follows: Ernest Mckay still denies any needs or barriers at this time. He enjoys coming to rehab. He has been getting his to-do lists completed. Ernest Mckay continues to deny any psychosocial barriers or concerns at this time. Ernest Mckay continues  to deny any psychosocial barriers or concerns at this time.     Expected Outcomes For Ernest Mckay to participate in PR free of any psy/soc concerns or barriers. For Ernest Mckay to participate in PR free of any psy/soc concerns or barriers. For Ernest Mckay to participate in PR free of any psy/soc concerns or barriers.     Interventions Encouraged to attend Pulmonary Rehabilitation for the exercise Encouraged to attend Pulmonary Rehabilitation for the exercise Encouraged to attend Pulmonary Rehabilitation for the exercise     Continue Psychosocial Services  No Follow up required No Follow up required No Follow up required              Psychosocial Discharge (Final Psychosocial Re-Evaluation):  Psychosocial Re-Evaluation - 08/27/23 1610       Psychosocial Re-Evaluation   Current issues with None Identified    Comments Ernest Mckay continues to deny any psychosocial barriers or concerns at this time.    Expected Outcomes For Ernest Mckay to participate in PR free of any psy/soc concerns or barriers.    Interventions Encouraged to attend Pulmonary Rehabilitation for the exercise    Continue Psychosocial Services  No Follow up required             Education: Education Goals: Education classes will be provided on a weekly basis, covering required topics. Participant will state understanding/return demonstration of topics presented.  Learning Barriers/Preferences:  Learning Barriers/Preferences - 06/18/23 1045       Learning Barriers/Preferences   Learning Barriers None    Learning Preferences Skilled Demonstration             Education Topics: Know Your Numbers Group instruction that is supported by a PowerPoint presentation. Instructor discusses importance of knowing and understanding resting, exercise, and post-exercise oxygen saturation, heart rate, and blood pressure. Oxygen saturation, heart rate, blood pressure, rating of perceived exertion, and dyspnea are reviewed along with a normal range for  these values.  Flowsheet Row PULMONARY REHAB CHRONIC OBSTRUCTIVE PULMONARY DISEASE from 06/28/2023 in [facility] Heart, Vascular, & Lung [facility]  Date 06/28/23  Educator EP  Instruction Review Code 1- Verbalizes Understanding       Exercise for the Pulmonary Patient Group instruction that is supported by a PowerPoint presentation. Instructor discusses benefits of exercise, core components of exercise, frequency, duration,  and intensity of an exercise routine, importance of utilizing pulse oximetry during exercise, safety while exercising, and options of places to exercise outside of rehab.    MET Level  Group instruction provided by PowerPoint, verbal discussion, and written material to support subject matter. Instructor reviews what METs are and how to increase METs.  Flowsheet Row PULMONARY REHAB CHRONIC OBSTRUCTIVE PULMONARY DISEASE from 08/16/2023 in [facility] for Heart, Vascular, & Lung Health  Date 08/16/23  Educator EP  Instruction Review Code 1- Verbalizes Understanding       Pulmonary Medications Verbally interactive group education provided by instructor with focus on inhaled medications and proper administration.   Anatomy and Physiology of the Respiratory System Group instruction provided by PowerPoint, verbal discussion, and written material to support subject matter. Instructor reviews respiratory cycle and anatomical components of the respiratory system and their functions. Instructor also reviews differences in obstructive and restrictive respiratory diseases with examples of each.  Flowsheet Row PULMONARY REHAB CHRONIC OBSTRUCTIVE PULMONARY DISEASE from 08/30/2023 in [facility] for Heart, Vascular, & Lung Health  Date 08/30/23  Educator RT  Instruction Review Code 1- Verbalizes Understanding       Oxygen Safety Group instruction provided by PowerPoint, verbal discussion, and written  material to support subject matter. There is an overview of "What is Oxygen" and "Why do we need it".  Instructor also reviews how to create a safe environment for oxygen use, the importance of using oxygen as prescribed, and the risks of noncompliance. There is a brief discussion on traveling with oxygen and resources the patient may utilize. Flowsheet Row PULMONARY REHAB CHRONIC OBSTRUCTIVE PULMONARY DISEASE from 07/05/2023 in [facility] for Heart, Vascular, & Lung Health  Date 07/05/23  Educator EP  Instruction Review Code 1- Verbalizes Understanding       Oxygen Use Group instruction provided by PowerPoint, verbal discussion, and written material to discuss how supplemental oxygen is prescribed and different types of oxygen supply systems. Resources for more information are provided.    Breathing Techniques Group instruction that is supported by demonstration and informational handouts. Instructor discusses the benefits of pursed lip and diaphragmatic breathing and detailed demonstration on how to perform both.  Flowsheet Row PULMONARY REHAB CHRONIC OBSTRUCTIVE PULMONARY DISEASE from 07/19/2023 in [facility] for Heart, Vascular, & Lung Health  Date 07/19/23  Educator RN  Instruction Review Code 1- Verbalizes Understanding        Risk Factor Reduction Group instruction that is supported by a PowerPoint presentation. Instructor discusses the definition of a risk factor, different risk factors for pulmonary disease, and how the heart and lungs work together. Flowsheet Row PULMONARY REHAB CHRONIC OBSTRUCTIVE PULMONARY DISEASE from 08/09/2023 in [facility] for Heart, Vascular, & Lung Health  Date 08/09/23  Educator EP  Instruction Review Code 1- Verbalizes Understanding       Pulmonary Diseases Group instruction provided by PowerPoint, verbal discussion, and written material to support subject matter. Instructor  gives an overview of the different type of pulmonary diseases. There is also a discussion on risk factors and symptoms as well as ways to manage the diseases. Flowsheet Row PULMONARY REHAB CHRONIC OBSTRUCTIVE PULMONARY DISEASE from 08/23/2023 in [facility] for Heart, Vascular, & Lung Health  Date 08/23/23  Educator RT  Instruction Review Code 1- Verbalizes Understanding       Stress and Energy Conservation Group instruction provided by PowerPoint, verbal discussion,  and written material to support subject matter. Instructor gives an overview of stress and the impact it can have on the body. Instructor also reviews ways to reduce stress. There is also a discussion on energy conservation and ways to conserve energy throughout the day. Flowsheet Row PULMONARY REHAB CHRONIC OBSTRUCTIVE PULMONARY DISEASE from 07/26/2023 in [facility] for Heart, Vascular, & Lung Health  Date 07/26/23  Educator RN  Instruction Review Code 1- Verbalizes Understanding       Warning Signs and Symptoms Group instruction provided by PowerPoint, verbal discussion, and written material to support subject matter. Instructor reviews warning signs and symptoms of stroke, heart attack, cold and flu. Instructor also reviews ways to prevent the spread of infection. Flowsheet Row PULMONARY REHAB CHRONIC OBSTRUCTIVE PULMONARY DISEASE from 08/02/2023 in [facility] for Heart, Vascular, & Lung Health  Date 08/02/23  Educator RN  Instruction Review Code 1- Verbalizes Understanding       Other Education Group or individual verbal, written, or video instructions that support the educational goals of the pulmonary rehab program.    Knowledge Questionnaire Score:  Knowledge Questionnaire Score - 06/18/23 1142       Knowledge Questionnaire Score   Pre Score 14/18             Core Components/Risk Factors/Patient Goals at Admission:  Personal Goals  and Risk Factors at Admission - 06/18/23 1045       Core Components/Risk Factors/Patient Goals on Admission   Improve shortness of breath with ADL's Yes    Intervention Provide education, individualized exercise plan and daily activity instruction to help decrease symptoms of SOB with activities of daily living.    Expected Outcomes Short Term: Improve cardiorespiratory fitness to achieve a reduction of symptoms when performing ADLs;Long Term: Be able to perform more ADLs without symptoms or delay the onset of symptoms             Core Components/Risk Factors/Patient Goals Review:   Goals and Risk Factor Review     Row Name 07/04/23 1049 07/30/23 1047 08/27/23 0939         Core Components/Risk Factors/Patient Goals Review   Personal Goals Review Improve shortness of breath with ADL's;Develop more efficient breathing techniques such as purse lipped breathing and diaphragmatic breathing and practicing self-pacing with activity. Improve shortness of breath with ADL's;Develop more efficient breathing techniques such as purse lipped breathing and diaphragmatic breathing and practicing self-pacing with activity. Improve shortness of breath with ADL's;Develop more efficient breathing techniques such as purse lipped breathing and diaphragmatic breathing and practicing self-pacing with activity.     Review Monthly review of patient's Core Components/Risk Factors/Patient Goals are as follows: Goal in progress for improving his shortness of breath with ADLs and developing more efficient breathing techniques such as purse lipped breathing and diaphragmatic breathing; and practicing self-pacing with activity. Ernest Mckay has completed 3 classes and has been slowly increasing his workload and METs. His oxygen needs have been stable on room air. He is trying hard to build up his endurance and stamina to complete activities of daily living at home with decreased shortness of breath. Ernest Mckay will continue to benefit  from PR for nutrition, education, exercise, and lifestyle modification. Monthly review of patient's Core Components/Risk Factors/Patient Goals are as follows: Goal in progress for improving his shortness of breath with ADLs and developing more efficient breathing techniques such as purse lipped breathing and diaphragmatic breathing; and practicing self-pacing with activity. He has recently transitioned  from walking the track to the treadmill. He is also exercising on the bike. His oxygen needs have been stable on room air. Ernest Mckay will continue to benefit from PR for nutrition, education, exercise, and lifestyle modification. Monthly review of patient's Core Components/Risk Factors/Patient Goals are as follows: Goal in progress for improving his shortness of breath with ADLs. He is currently exercising on RA to keep sats >88%. Goal met for developing more efficient breathing techniques such as purse lipped breathing and diaphragmatic breathing; and practicing self-pacing with activity. He has attended the breathing technique course and is able to demonstrate purse lip breathing when he gets SOB. He also knows how to self pace himself based on his rate of perceived exertion scale/dyspnea scores. We will continue to monitor his progress throughout the program.     Expected Outcomes For Ernest Mckay to develop more efficient breathing techniques such as purse lipped breathing and diaphragmatic breathing; and practice self-pacing with activity, and improve his shortness of breath with ADLs. For Ernest Mckay to develop more efficient breathing techniques such as purse lipped breathing and diaphragmatic breathing; and practice self-pacing with activity, and improve his shortness of breath with ADLs. For Bron to improve his shortness of breath with ADLs.              Core Components/Risk Factors/Patient Goals at Discharge (Final Review):   Goals and Risk Factor Review - 08/27/23 0939       Core Components/Risk  Factors/Patient Goals Review   Personal Goals Review Improve shortness of breath with ADL's;Develop more efficient breathing techniques such as purse lipped breathing and diaphragmatic breathing and practicing self-pacing with activity.    Review Monthly review of patient's Core Components/Risk Factors/Patient Goals are as follows: Goal in progress for improving his shortness of breath with ADLs. He is currently exercising on RA to keep sats >88%. Goal met for developing more efficient breathing techniques such as purse lipped breathing and diaphragmatic breathing; and practicing self-pacing with activity. He has attended the breathing technique course and is able to demonstrate purse lip breathing when he gets SOB. He also knows how to self pace himself based on his rate of perceived exertion scale/dyspnea scores. We will continue to monitor his progress throughout the program.    Expected Outcomes For Niranjan to improve his shortness of breath with ADLs.             ITP Comments: Pt is making expected progress toward Pulmonary Rehab goals after completing 20 session(s). Recommend continued exercise, life style modification, education, and utilization of breathing techniques to increase stamina and strength, while also decreasing shortness of breath with exertion.    Comments:  Dr. Genetta Kenning is Medical Director for Pulmonary Rehab at [facility].

## 2023-09-06 ENCOUNTER — Encounter (HOSPITAL_COMMUNITY)
Admission: RE | Admit: 2023-09-06 | Discharge: 2023-09-06 | Disposition: A | Discharge status: Home / Self Care | Source: Ambulatory Visit | Attending: Pulmonary Disease | Admitting: Pulmonary Disease

## 2023-09-06 DIAGNOSIS — J449 Chronic obstructive pulmonary disease, unspecified: Secondary | ICD-10-CM

## 2023-09-06 NOTE — Progress Notes (Signed)
 Daily Session Note  Patient Details  Name: Ernest Mckay MRN: [id number] Date of Birth: [birth year] Referring Provider:   [name] Pulmonary Rehab Walk Test from 06/18/2023 in [facility] for Heart, Vascular, & Lung [facility]  Referring Provider [name]    Encounter Date: 09/06/2023  Check In:  Session Check In - 09/06/23 0827       Check-In   Supervising physician immediately available to respond to emergencies [facility] MD immediately available    Physician(s) [name], NP    Location [facility]    Staff Present Sueellen Emery BS, ACSM-CEP, Exercise Physiologist;Kaylee Nolon Baxter, MS, ACSM-CEP, Exercise Physiologist;Kaylie Ritter Carmen Chol, RN, BSN;Samantha Belarus, RD, LDN    Virtual Visit No    Medication changes reported     No    Fall or balance concerns reported    No    Tobacco Cessation No Change    Warm-up and Cool-down Performed as group-led instruction    Resistance Training Performed Yes    VAD Patient? No    PAD/SET Patient? No      Pain Assessment   Currently in Pain? No/denies          Capillary Blood Glucose: No results found for this or any previous visit (from the past 24 hours).    Social History   Tobacco Use  Smoking Status Former   Types: Cigars   Quit date: 10/11/1998   Years since quitting: 24.9  Smokeless Tobacco Current   Types: Snuff  Tobacco Comments   50 years of snuff use    Goals Met:  Proper associated with RPD/PD & O2 Sat Independence with exercise equipment Exercise tolerated well No report of concerns or symptoms today Strength training completed today  Goals Unmet:  Not Applicable  Comments: Service time is from 0810 to 0936.    Dr. Genetta Kenning is Medical Director for Pulmonary Rehab at [facility].

## 2023-09-11 ENCOUNTER — Encounter (HOSPITAL_COMMUNITY)
Admission: RE | Admit: 2023-09-11 | Discharge: 2023-09-11 | Disposition: A | Discharge status: Home / Self Care | Source: Ambulatory Visit | Attending: Pulmonary Disease | Admitting: Pulmonary Disease

## 2023-09-11 DIAGNOSIS — J449 Chronic obstructive pulmonary disease, unspecified: Secondary | ICD-10-CM

## 2023-09-11 NOTE — Progress Notes (Signed)
 Daily Session Note  Patient Details  Name: Ernest Mckay MRN: [id number] Date of Birth: [birth year] Referring Provider:   [name] Pulmonary Rehab Walk Test from 06/18/2023 in [facility]  Referring Provider [name]    Encounter Date: 09/11/2023  Check In:  Session Check In - 09/11/23 0829       Check-In   Supervising physician immediately available to respond to emergencies [facility] MD immediately available    Physician(s) Levin Reamer, NP    Location [facility]    Staff Present Sueellen Emery BS, ACSM-CEP, Exercise Physiologist;Kaylee Nolon Baxter, MS, ACSM-CEP, Exercise Physiologist;Manasvi Dickard Carmen Chol, RN, BSN    Virtual Visit No    Medication changes reported     No    Fall or balance concerns reported    No    Tobacco Cessation No Change    Warm-up and Cool-down Performed as group-led instruction    Resistance Training Performed Yes    VAD Patient? No    PAD/SET Patient? No      Pain Assessment   Currently in Pain? No/denies    Multiple Pain Sites No          Capillary Blood Glucose: No results found for this or any previous visit (from the past 24 hours).    Social History   Tobacco Use  Smoking Status Former   Types: Cigars   Quit date: 10/11/1998   Years since quitting: 24.9  Smokeless Tobacco Current   Types: Snuff  Tobacco Comments   50 years of snuff use    Goals Met:  Proper associated with RPD/PD & O2 Sat Independence with exercise equipment Exercise tolerated well No report of concerns or symptoms today Strength training completed today  Goals Unmet:  Not Applicable  Comments: Service time is from 0809 to [phone].    Dr. Genetta Kenning is Medical Director for Pulmonary Rehab at [facility].

## 2023-09-13 ENCOUNTER — Encounter (HOSPITAL_COMMUNITY)
Admission: RE | Admit: 2023-09-13 | Discharge: 2023-09-13 | Disposition: A | Discharge status: Home / Self Care | Source: Ambulatory Visit | Attending: Pulmonary Disease | Admitting: Pulmonary Disease

## 2023-09-13 DIAGNOSIS — J449 Chronic obstructive pulmonary disease, unspecified: Secondary | ICD-10-CM | POA: Diagnosis not present

## 2023-09-13 NOTE — Progress Notes (Signed)
 Daily Session Note  Patient Details  Name: Ernest Mckay MRN: [id number] Date of Birth: 04-12-1945 Referring Provider:   [name] Pulmonary Rehab Walk Test from 06/18/2023 in [facility]  Referring Provider [name]    Encounter Date: 09/13/2023  Check In:  Session Check In - 09/13/23 6213       Check-In   Physician(s) Lawana Pray, NP    [facility]    Staff Present Sueellen Emery BS, ACSM-CEP, Exercise Physiologist;Kaylee Nolon Baxter, MS, ACSM-CEP, Exercise Physiologist;Oliviah Agostini Carmen Chol, RN, BSN    Virtual Visit No    Medication changes reported     No    Fall or balance concerns reported    No    Tobacco Cessation No Change    Warm-up and Cool-down Performed as group-led instruction    Resistance Training Performed Yes    VAD Patient? No    PAD/SET Patient? No      Pain Assessment   Currently in Pain? No/denies    Multiple Pain Sites No          Capillary Blood Glucose: No results found for this or any previous visit (from the past 24 hours).    Social History   Tobacco Use  Smoking Status Former   Types: Cigars   Quit date: 10/11/1998   Years since quitting: 24.9  Smokeless Tobacco Current   Types: Snuff  Tobacco Comments   50 years of snuff use    Goals Met:  Proper associated with RPD/PD & O2 Sat Independence with exercise equipment Exercise tolerated well No report of concerns or symptoms today Strength training completed today  Goals Unmet:  Not Applicable  Comments: Service time is from 0809 to 0935.    Dr. Genetta Kenning is Medical Director for Pulmonary Rehab at [facility].

## 2023-09-17 NOTE — Progress Notes (Signed)
 Discharge Progress Report  Patient Details  Name: Ernest Mckay MRN: [id number] Date of Birth: 11/14/1945 Referring Provider:   Conrad Ports Pulmonary Rehab Walk Test from 06/18/2023 in [facility]  Referring Provider [name]     Number of Visits: 29  Reason for Discharge:  Patient reached a stable level of exercise. Patient independent in their exercise. Patient has met program and personal goals.  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)  ADL [facility]:  Pulmonary Assessment Scores     Row Name 06/18/23 1047 09/11/23 0851       ADL UCSD   ADL Phase Entry Exit    SOB Score total 37 12      CAT Score   CAT Score 19 8      mMRC Score   mMRC Score 1 0       Initial Exercise Prescription:  Initial Exercise Prescription - 06/18/23 1100       Date of Initial Exercise RX and Referring Provider   Date 06/18/23    Referring Provider [name]    Expected Discharge Date 09/13/23      Treadmill   MPH 2    Grade 0    Minutes 15    METs 2.5      Bike   Level 2    Minutes 15    METs 2.5      Prescription Details   Frequency (times per week) 2    Duration Progress to 30 minutes of continuous aerobic without signs/symptoms of physical distress      Intensity   THRR 40-80% of Max Heartrate 58-115    Ratings of Perceived Exertion 11-13    Perceived Dyspnea 0-4      Progression   Progression Continue to progress workloads to maintain intensity without signs/symptoms of physical distress.      Resistance Training   Training Prescription Yes    Weight black bands    Reps 10-15          Discharge Exercise Prescription (Final Exercise Prescription  Changes):  Exercise Prescription Changes - 08/30/23 0900       Response to Exercise   Blood Pressure (Admit) 110/60    Blood Pressure (Exit) 104/70    Heart Rate (Admit) 87 bpm    Heart Rate (Exercise) 124 bpm    Heart Rate (Exit) 89 bpm    Oxygen Saturation (Admit) 96 %    Oxygen Saturation (Exercise) 97 %    Oxygen Saturation (Exit) 97 %    Rating of Perceived Exertion (Exercise) 9    Perceived Dyspnea (Exercise) 0    Duration Continue with 30 min of aerobic exercise without signs/symptoms of physical distress.    Intensity THRR unchanged      Progression   Progression Continue to progress workloads to maintain intensity without signs/symptoms of physical distress.      Resistance Training   Training Prescription Yes    Weight black bands    Reps 10-15    Time 10 Minutes      Treadmill   MPH 2.2    Grade 3.5    Minutes 15  METs 3.4      Bike   Level 5    Minutes 15    METs 4      Home Exercise Plan   Plans to continue exercise at Home (comment)    Frequency --   N/A   Initial Home Exercises Provided 08/30/23          Functional Capacity:  6 Minute Walk     Row Name 06/18/23 1102 06/18/23 1130 09/11/23 0848     6 Minute Walk   Phase -- Initial Discharge   Distance -- 1220 feet 1503 feet   Distance % Change -- -- 23.2 %   Distance Feet Change -- -- 283 ft   Walk Time -- 6 minutes 6 minutes   # of Rest Breaks -- 0 0   MPH -- 2.31 2.85   METS -- 2.88 3.26   RPE -- 11 11   Perceived Dyspnea  -- 3 0   VO2 Peak -- 10.08 11.4   Symptoms -- No No   Resting HR -- 90 bpm 90 bpm   Resting BP -- 140/72 118/72   Resting Oxygen Saturation  -- 97 % 98 %   Exercise Oxygen Saturation  during 6 min walk -- 97 % 96 %   Max Ex. HR -- 123 bpm 140 bpm   Max Ex. BP -- 176/70 138/74   2 Minute Post BP -- 142/70 122/74     Interval HR   1 Minute HR -- 110 110   2 Minute HR -- 118 118   3 Minute HR -- 119 112   4 Minute HR -- 118 140   5 Minute HR -- 120 129    6 Minute HR -- 123 133   2 Minute Post HR -- 95 97   Interval Heart Rate? -- Yes Yes     Interval Oxygen   Interval Oxygen? -- Yes Yes   Baseline Oxygen Saturation % -- 97 % 98 %   1 Minute Oxygen Saturation % -- 97 % 98 %   1 Minute Liters of Oxygen -- 0 L 0 L   2 Minute Oxygen Saturation % -- 97 % 97 %   2 Minute Liters of Oxygen -- 0 L 0 L   3 Minute Oxygen Saturation % -- 97 % 97 %   3 Minute Liters of Oxygen -- 0 L 0 L   4 Minute Oxygen Saturation % -- 97 % 97 %   4 Minute Liters of Oxygen -- 0 L 0 L   5 Minute Oxygen Saturation % -- 97 % 96 %   5 Minute Liters of Oxygen -- 0 L 0 L   6 Minute Oxygen Saturation % -- 98 % 99 %   6 Minute Liters of Oxygen -- 0 L 0 L   2 Minute Post Oxygen Saturation % -- 98 % 99 %   2 Minute Post Liters of Oxygen -- 0 L 0 L      Psychological, QOL, Others - Outcomes: PHQ 2/9:    09/11/2023    8:52 AM 06/18/2023   10:49 AM 06/18/2023   10:40 AM 10/08/2019    9:31 AM 10/09/2018    8:57 AM  Depression screen PHQ 2/9  Decreased Interest 0 0 1 0 0  Down, Depressed, Hopeless 1 1 0 0 0  PHQ - 2 Score 1 1 1  0 0  Altered sleeping 0 1     Tired, decreased energy 0  2     Change in appetite 0 0     Feeling bad or failure about yourself  0 0     Trouble concentrating 0 1     Moving slowly or fidgety/restless 0 1     Suicidal thoughts 0 0     PHQ-9 Score 1 6     Difficult doing work/chores Not difficult at all Somewhat difficult       Quality of Life:   Personal Goals: Goals established at orientation with interventions provided to work toward goal.  Personal Goals and Risk Factors at Admission - 06/18/23 1045       Core Components/Risk Factors/Patient Goals on Admission   Improve shortness of breath with ADL's Yes    Intervention Provide education, individualized exercise plan and daily activity instruction to help decrease symptoms of SOB with activities of daily living.    Expected Outcomes Short Term: Improve cardiorespiratory fitness to  achieve a reduction of symptoms when performing ADLs;Long Term: Be able to perform more ADLs without symptoms or delay the onset of symptoms           Personal Goals Discharge:  Goals and Risk Factor Review     Row Name 07/04/23 1049 07/30/23 1047 08/27/23 0939         Core Components/Risk Factors/Patient Goals Review   Personal Goals Review Improve shortness of breath with ADL's;Develop more efficient breathing techniques such as purse lipped breathing and diaphragmatic breathing and practicing self-pacing with activity. Improve shortness of breath with ADL's;Develop more efficient breathing techniques such as purse lipped breathing and diaphragmatic breathing and practicing self-pacing with activity. Improve shortness of breath with ADL's;Develop more efficient breathing techniques such as purse lipped breathing and diaphragmatic breathing and practicing self-pacing with activity.     Review Monthly review of patient's Core Components/Risk Factors/Patient Goals are as follows: Goal in progress for improving his shortness of breath with ADLs and developing more efficient breathing techniques such as purse lipped breathing and diaphragmatic breathing; and practicing self-pacing with activity. Taray has completed 3 classes and has been slowly increasing his workload and METs. His oxygen needs have been stable on room air. He is trying hard to build up his endurance and stamina to complete activities of daily living at home with decreased shortness of breath. Hilary will continue to benefit from PR for nutrition, education, exercise, and lifestyle modification. Monthly review of patient's Core Components/Risk Factors/Patient Goals are as follows: Goal in progress for improving his shortness of breath with ADLs and developing more efficient breathing techniques such as purse lipped breathing and diaphragmatic breathing; and practicing self-pacing with activity. He has recently transitioned from walking  the track to the treadmill. He is also exercising on the bike. His oxygen needs have been stable on room air. Sahej will continue to benefit from PR for nutrition, education, exercise, and lifestyle modification. Monthly review of patient's Core Components/Risk Factors/Patient Goals are as follows: Goal in progress for improving his shortness of breath with ADLs. He is currently exercising on RA to keep sats >88%. Goal met for developing more efficient breathing techniques such as purse lipped breathing and diaphragmatic breathing; and practicing self-pacing with activity. He has attended the breathing technique course and is able to demonstrate purse lip breathing when he gets SOB. He also knows how to self pace himself based on his rate of perceived exertion scale/dyspnea scores. We will continue to monitor his progress throughout the program.     Expected Outcomes For [facility]  to develop more efficient breathing techniques such as purse lipped breathing and diaphragmatic breathing; and practice self-pacing with activity, and improve his shortness of breath with ADLs. For Bralyn to develop more efficient breathing techniques such as purse lipped breathing and diaphragmatic breathing; and practice self-pacing with activity, and improve his shortness of breath with ADLs. For Allard to improve his shortness of breath with ADLs.        Exercise Goals and Review:  Exercise Goals     Row Name 06/18/23 1053             Exercise Goals   Increase Physical Activity Yes       Intervention Provide advice, education, support and counseling about physical activity/exercise needs.;Develop an individualized exercise prescription for aerobic and resistive training based on initial evaluation findings, risk stratification, comorbidities and participant's personal goals.       Expected Outcomes Short Term: Attend rehab on a regular basis to increase amount of physical activity.;Long Term: Add in home exercise to make  exercise part of routine and to increase amount of physical activity.;Long Term: Exercising regularly at least 3-5 days a week.       Increase Strength and Stamina Yes       Intervention Provide advice, education, support and counseling about physical activity/exercise needs.;Develop an individualized exercise prescription for aerobic and resistive training based on initial evaluation findings, risk stratification, comorbidities and participant's personal goals.       Expected Outcomes Short Term: Increase workloads from initial exercise prescription for resistance, speed, and METs.;Short Term: Perform resistance training exercises routinely during rehab and add in resistance training at home;Long Term: Improve cardiorespiratory fitness, muscular endurance and strength as measured by increased METs and functional capacity ( )       Able to understand and use rate of perceived exertion (RPE) scale Yes       Intervention Provide education and explanation on how to use RPE scale       Expected Outcomes Short Term: Able to use RPE daily in rehab to express subjective intensity level;Long Term:  Able to use RPE to guide intensity level when exercising independently       Able to understand and use Dyspnea scale Yes       Intervention Provide education and explanation on how to use Dyspnea scale       Expected Outcomes Short Term: Able to use Dyspnea scale daily in rehab to express subjective sense of shortness of breath during exertion;Long Term: Able to use Dyspnea scale to guide intensity level when exercising independently       Knowledge and understanding of Target Heart Rate Range (THRR) Yes       Intervention Provide education and explanation of THRR including how the numbers were predicted and where they are located for reference       Expected Outcomes Short Term: Able to state/look up THRR;Long Term: Able to use THRR to govern intensity when exercising independently;Short Term: Able to use daily  as guideline for intensity in rehab       Understanding of Exercise Prescription Yes       Intervention Provide education, explanation, and written materials on patient's individual exercise prescription       Expected Outcomes Short Term: Able to explain program exercise prescription;Long Term: Able to explain home exercise prescription to exercise independently          Exercise Goals Re-Evaluation:  Exercise Goals Re-Evaluation     Row Name 07/02/23 [id number] 08/01/23  9080 08/29/23 0921 09/17/23 0943       Exercise Goal Re-Evaluation   Exercise Goals Review Increase Physical Activity;Able to understand and use Dyspnea scale;Understanding of Exercise Prescription;Increase Strength and Stamina;Knowledge and understanding of Target Heart Rate Range (THRR);Able to understand and use rate of perceived exertion (RPE) scale Increase Physical Activity;Able to understand and use Dyspnea scale;Understanding of Exercise Prescription;Increase Strength and Stamina;Knowledge and understanding of Target Heart Rate Range (THRR);Able to understand and use rate of perceived exertion (RPE) scale Increase Physical Activity;Able to understand and use Dyspnea scale;Understanding of Exercise Prescription;Increase Strength and Stamina;Knowledge and understanding of Target Heart Rate Range (THRR);Able to understand and use rate of perceived exertion (RPE) scale Increase Physical Activity;Able to understand and use Dyspnea scale;Understanding of Exercise Prescription;Increase Strength and Stamina;Knowledge and understanding of Target Heart Rate Range (THRR);Able to understand and use rate of perceived exertion (RPE) scale    Comments Harvin has completed 2 exercise sessions. He exercises for 15 on the track and upright bike. Eloy averages 2.69 METs on the track and 2.8 METs at level 2 on the upright bike. He performs the warmup and cooldown standing with some struggle. Rodge was on the treadmill but had to be transferred to  the track due to gait instability. Will continue to monitor and progress as able. Mae has completed 11 exercise sessions. He exercises for 15 on the treadmill and upright bike. Rayden averages 2.5 METs at 1.8 mph and 1% incline on the treadmill and 3.6 METs at level 4 on the upright bike. He performs the warmup and cooldown standing without limitations. Warmup and cooldown exercises have improved. Quasim has also been progressed to the treadmill. He tolerates the treadmill fair and walks at a slow place. Will increase treadmill speed when he tolerates it better. Will continue to monitor and progress as able. Kenji has completed 19 exercise sessions. He exercises for 15 on the treadmill and upright bike. Kyran averages 3.8 METs at 2.2 mph and 4% incline on the treadmill and 4 METs at level  on the upright bike. He performs the warmup and cooldown standing without limitations. Vashawn has increased his speed and incline on the treadmill and level on the upright bike. METs have significantly increased as Carlson tolerates progressions well. Will discuss home exericse soon. Will continue to monitor and progress as able. Travis has completed 23 exercise sessions. Peak METs were 3.7 on the treadmill and 4.1 on the upright bike. Patient plans to continue exercise at home.    Expected Outcomes Through exercise at rehab and home, the patient will decrease shortness of breath with daily activities and feel confident in carrying out an exercise regimen at home. Through exercise at rehab and home, the patient will decrease shortness of breath with daily activities and feel confident in carrying out an exercise regimen at home. Through exercise at rehab and home, the patient will decrease shortness of breath with daily activities and feel confident in carrying out an exercise regimen at home. Through exercise at rehab and home, the patient will decrease shortness of breath with daily activities and feel confident in carrying  out an exercise regimen at home.       Nutrition & Weight - Outcomes:  Pre Biometrics - 06/18/23 1101       Pre Biometrics   Grip Strength 38 kg           Nutrition:  Nutrition Therapy & Goals - 08/30/23 0847       Nutrition Therapy  Diet Heart Healthy diet    Drug/Food Interactions Statins/Certain Fruits      Personal Nutrition Goals   Nutrition Goal Patient to improve diet quality by using the plate method as a guide for meal planning to include lean protein/plant protein, fruits, vegetables, whole grains, nonfat dairy as part of a well-balanced diet.   goal in progress.   Personal Goal #2 Patient to identify strategies for weight loss of 0.5-2.0# per week.   goal in progress.   Comments Goals in progress. Marcella has medical history of COPD1, HTN, hyperlipidemia. Lipids WNL. He is motivated to lose weight with goal weight ~200#. We have discussed multiple strategies for weight loss including calorie density, the plate method as a guide for meal planning, benefits of high fiber/high protein intake, etc. He is down 6.6# since starting with our program. Patient will continue to benefit from participation in pulmonary rehab for nutrition, exercise, and lifestyle modification support.      Intervention Plan   Intervention Prescribe, educate and counsel regarding individualized specific dietary modifications aiming towards targeted core components such as weight, hypertension, lipid management, diabetes, heart failure and other comorbidities.;Nutrition handout(s) given to patient.    Expected Outcomes Short Term Goal: Understand basic principles of dietary content, such as calories, fat, sodium, cholesterol and nutrients.;Long Term Goal: Adherence to prescribed nutrition plan.          Nutrition Discharge:  Nutrition Assessments - 09/11/23 0852       Rate Your Plate Scores   Post Score 53          Education Questionnaire Score:  Knowledge Questionnaire Score - 09/11/23  0851       Knowledge Questionnaire Score   Post Score 15/18         Tarick graduated the program on 6/19 completing 23 sessions. At time of graduation Arnol continued to deny any psychosocial barriers or concerns at this time. Declined any reources or referrals.   Graduation review of patient's Core Components/Risk Factors/Patient Goals are as follows: Goal met for improving his shortness of breath with ADLs. He is currently exercising on RA to keep sats >88%. Langdon's dyspnea score decreased from a 37 to a 12, his CAT score decreased from a 19 to an 8, and his MMRC decreased from a 1 to a 0. Yordan has enjoyed the program and made great progress.   Goals reviewed with patient; copy given to patient.
